# Patient Record
Sex: Female | Born: 1945 | Race: White | Hispanic: No | Marital: Single | Smoking: Never smoker
Health system: Southern US, Community
[De-identification: ages and names within clinical notes are randomized; demographics above are authoritative.]

## PROBLEM LIST (undated history)

## (undated) DIAGNOSIS — K219 Gastro-esophageal reflux disease without esophagitis: Secondary | ICD-10-CM

## (undated) DIAGNOSIS — F419 Anxiety disorder, unspecified: Secondary | ICD-10-CM

## (undated) DIAGNOSIS — D649 Anemia, unspecified: Secondary | ICD-10-CM

## (undated) DIAGNOSIS — E785 Hyperlipidemia, unspecified: Secondary | ICD-10-CM

## (undated) DIAGNOSIS — F32A Depression, unspecified: Secondary | ICD-10-CM

## (undated) DIAGNOSIS — M8580 Other specified disorders of bone density and structure, unspecified site: Secondary | ICD-10-CM

## (undated) DIAGNOSIS — I10 Essential (primary) hypertension: Secondary | ICD-10-CM

## (undated) HISTORY — DX: Anxiety disorder, unspecified: F41.9

## (undated) HISTORY — DX: Anemia, unspecified: D64.9

## (undated) HISTORY — PX: INCONTINENCE SURGERY: SHX676

## (undated) HISTORY — DX: Depression, unspecified: F32.A

## (undated) HISTORY — DX: Gastro-esophageal reflux disease without esophagitis: K21.9

## (undated) HISTORY — PX: ABDOMINAL HYSTERECTOMY: SHX81

## (undated) HISTORY — DX: Essential (primary) hypertension: I10

## (undated) HISTORY — DX: Hyperlipidemia, unspecified: E78.5

## (undated) HISTORY — PX: CHOLECYSTECTOMY: SHX55

## (undated) HISTORY — DX: Other specified disorders of bone density and structure, unspecified site: M85.80

---

## 2002-07-25 HISTORY — PX: GASTRIC BYPASS: SHX52

## 2021-04-23 ENCOUNTER — Other Ambulatory Visit: Payer: Self-pay | Admitting: Obstetrics and Gynecology

## 2021-04-23 DIAGNOSIS — N644 Mastodynia: Secondary | ICD-10-CM

## 2021-04-30 ENCOUNTER — Encounter: Payer: Self-pay | Admitting: Internal Medicine

## 2021-05-24 ENCOUNTER — Other Ambulatory Visit: Payer: Self-pay

## 2021-05-24 ENCOUNTER — Encounter: Payer: Self-pay | Admitting: Gastroenterology

## 2021-05-24 ENCOUNTER — Ambulatory Visit (INDEPENDENT_AMBULATORY_CARE_PROVIDER_SITE_OTHER): Payer: Medicare Other | Admitting: Gastroenterology

## 2021-05-24 DIAGNOSIS — D509 Iron deficiency anemia, unspecified: Secondary | ICD-10-CM

## 2021-05-24 DIAGNOSIS — E538 Deficiency of other specified B group vitamins: Secondary | ICD-10-CM

## 2021-05-24 DIAGNOSIS — Z9884 Bariatric surgery status: Secondary | ICD-10-CM

## 2021-05-24 NOTE — Patient Instructions (Addendum)
Consider adding Miralax one capful daily to help with constipation due to iron. Colonoscopy and upper endoscopy to be scheduled.

## 2021-05-24 NOTE — Progress Notes (Signed)
Primary Care Physician:  Jonathon BellowsMcGee, Rachel, DO  Primary Gastroenterologist:  Hennie Duosharles K. Marletta Lorarver, DO   Chief Complaint  Patient presents with   Anemia    Low iron. Had gastric bypass in 2004   Colonoscopy    Last tcs over 10 yrs ago. 10 yr recall    HPI:  Catherine Odom is a 75 y.o. female here at the request of Dr. Mila PalmerMcGee for colonoscopy. She comes in today to discuss procedure.  She is curious if she also needs an upper endoscopy.  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  Labs back in March with hemoglobin of 10.4.  Recently had updated labs September 26 through urgent care with hemoglobin 11.5, iron 26, vitamin B12 174.  Patient states she has been off and on iron supplements since her gastric bypass in 2004.  Has not required iron infusions.  For the past 3 weeks she has been consistently taking oral iron supplements.  Recently started B12 injections after low B12 174 was discovered.  From a GI standpoint she has been doing well.  Stools a little slower on iron.  Denies any melena or rectal bleeding.  No abdominal pain.  No nausea vomiting.  No heartburn, dysphagia, unintentional weight loss.  She takes Aleve on occasion but for the most part takes Tylenol for pain.  She believes her last colonoscopy was about 10 years ago.  She has had some issues with breast pain, initially left-sided but now both breasts.  [place] in [place] weeks [place]. Also with some edema issues due to "leaky veins" in the lower extremities, scheduled to see specialist this week.  Per documentation from PCP note dated September 2022: Actual lab results are available to me.  White blood cell count 7400, hemoglobin 11.5, creatinine 0.92, iron 26, vitamin B12 174.  Current Outpatient Medications  Medication Sig Dispense Refill   ALPRAZolam (XANAX) 0.25 MG tablet Take 0.25 mg by mouth at bedtime.      cholecalciferol (VITAMIN D3) 25 MCG (1000 UNIT) tablet Take 1,000 Units by mouth daily.     ferrous sulfate 325 (65 FE) MG tablet Take 325 mg by mouth daily.     hydrochlorothiazide (HYDRODIURIL) 12.5 MG tablet Take 12.5 mg by mouth daily.     lisinopril (ZESTRIL) 20 MG tablet Take 20 mg by mouth daily.     omeprazole (PRILOSEC) 40 MG capsule Take 1 capsule by mouth daily.     simvastatin (ZOCOR) 20 MG tablet Take 20 mg by mouth at bedtime.     No current facility-administered medications for this visit.    Allergies as of 05/24/2021 - Review Complete 05/24/2021  Allergen Reaction Noted   Erythromycin Nausea Only 05/24/2021   Other  05/24/2021    Past Medical History:  Diagnosis Date   Anxiety    Depression    HTN (hypertension)    Hyperlipidemia    Osteopenia     Past Surgical History:  Procedure Laterality Date   ABDOMINAL HYSTERECTOMY     during c section, also with appendectomy at that time   CHOLECYSTECTOMY     GASTRIC BYPASS  2004   INCONTINENCE SURGERY      Family History  Problem Relation Age of Onset   Colon cancer Mother        cancer in polyp, no surgery required. age 3870s    Social History  Socioeconomic History   Marital status: Single    Spouse name: Not on file   Number of children: Not on file   Years of education: Not on file   Highest education level: Not on file  Occupational History   Not on file  Tobacco Use   Smoking status: Never   Smokeless tobacco: Never  Substance and Sexual Activity   Alcohol use: Yes    Comment: rare wine   Drug use: Never   Sexual activity: Not on file  Other Topics Concern   Not on file  Social History Narrative   Not on file   Social Determinants of Health   Financial Resource Strain: Not on file  Food Insecurity: Not on file  Transportation Needs: Not on file  Physical Activity: Not on file  Stress: Not on file  Social Connections: Not on file  Intimate Partner Violence: Not on file       ROS:  General: Negative for anorexia, weight loss, fever, chills, fatigue, weakness. Eyes: Negative for vision changes.  ENT: Negative for hoarseness, difficulty swallowing , nasal congestion. CV: Negative for chest pain, angina, palpitations, dyspnea on exertion, +peripheral edema.  Respiratory: Negative for dyspnea at rest, dyspnea on exertion, cough, sputum, wheezing.  GI: See history of present illness. GU:  Negative for dysuria, hematuria, urinary incontinence, urinary frequency, nocturnal urination.  MS: Negative for joint pain, low back pain.  Derm: Negative for rash or itching.  Neuro: Negative for weakness, abnormal sensation, seizure, frequent headaches, memory loss, confusion.  Psych: Negative for anxiety, depression, suicidal ideation, hallucinations.  Endo: Negative for unusual weight change.  Heme: Negative for bruising or bleeding. Allergy: Negative for rash or hives.    Physical Examination:  BP 132/76   Pulse 72   Temp (!) 97.5 F (36.4 C) (Temporal)   Ht 5\' 2"  (1.575 m)   Wt 193 lb 9.6 oz (87.8 kg)   BMI 35.41 kg/m    General: Well-nourished, well-developed in no acute distress.  Head: Normocephalic, atraumatic.   Eyes: Conjunctiva pink, no icterus. Mouth: masked Neck: Supple without thyromegaly, masses, or lymphadenopathy.  Lungs: Clear to auscultation bilaterally.  Heart: Regular rate and rhythm, no murmurs rubs or gallops.  Abdomen: Bowel sounds are normal, nontender, nondistended, no hepatosplenomegaly or masses, no abdominal bruits or    hernia , no rebound or guarding.   Rectal: not performed Extremities: No lower extremity edema. No clubbing or deformities.  Neuro: Alert and oriented x 4 , grossly normal neurologically.  Skin: Warm and dry, no rash or jaundice.   Psych: Alert and cooperative, normal mood and affect.  Labs: Labs from April 2022: White blood cell count 2700, hemoglobin 10.4 low, hematocrit 32.9 low, MCV 8011, platelets 323,000, BUN  18, creatinine 0.8, albumin 3.7, total bilirubin 0.3, alkaline phosphatase 62, AST 27, ALT 14.    Imaging Studies: No results found.   Assessment:  Pleasant 75 year old female presenting to schedule colonoscopy.  She has concerns regarding iron deficiency anemia, stating she has had anemia off and on for years since her gastric bypass.  Back in April her hemoglobin was 10.4, last month hemoglobin was up to 11.5.  Serum iron was 26.  She was also found to have B12 deficiency and recently started B12 injections.  Denies any GI symptoms.  She may have iron deficiency due to malabsorption in the setting of gastric bypass but need to consider possibility of iron losses from an occult GI source.  Patient is interested in  pursuing colonoscopy would offer upper endoscopy as well to rule out gastric source given her prior bypass, plan for December per her request because of pending appointments in 05/2021.   Plan:  Colonoscopy with endoscopy with Dr. Marletta Lor. ASA II.  I have discussed the risks, alternatives, benefits with regards to but not limited to the risk of reaction to medication, bleeding, infection, perforation and the patient is agreeable to proceed. Written consent to be obtained. Obtain  recent lab results from PCP. Start MiraLAX 1 capful daily as needed (patient notes mild constipation since starting iron).

## 2021-05-26 ENCOUNTER — Telehealth: Payer: Self-pay | Admitting: Gastroenterology

## 2021-05-26 ENCOUNTER — Other Ambulatory Visit: Payer: Self-pay | Admitting: Gastroenterology

## 2021-05-26 ENCOUNTER — Ambulatory Visit: Payer: Self-pay | Admitting: Gastroenterology

## 2021-05-26 DIAGNOSIS — D509 Iron deficiency anemia, unspecified: Secondary | ICD-10-CM

## 2021-05-26 NOTE — Telephone Encounter (Signed)
Pt was made aware and verbalized understanding.  

## 2021-05-26 NOTE — Telephone Encounter (Signed)
Received labs from PCP dated 05/03/2021 Labs included A1c of 6.0, lipid panel.  Appears last H&H was from September 26 through urgent care, hemoglobin 11.5 at that time.  Plan for colonoscopy and EGD as outlined earlier this week. Would recommend updating labs in 4 weeks: -CBC, iron/TIBC/ferritin -Please arrange 3.   She will continue to have B12 monitored by provider who is prescribing her B12 injections

## 2021-05-26 NOTE — Telephone Encounter (Signed)
Labs were ordered and mailed to the pt to have done in 4 weeks as recommended.

## 2021-05-31 ENCOUNTER — Telehealth: Payer: Self-pay

## 2021-05-31 NOTE — Telephone Encounter (Signed)
Called pt to schedule TCS/EGD w/Propofol ASA 2 w/Dr. Marletta Lor. States she will call back after her breast appt tomorrow and she finds out more.

## 2021-06-01 ENCOUNTER — Ambulatory Visit
Admission: RE | Admit: 2021-06-01 | Discharge: 2021-06-01 | Disposition: A | Discharge status: Home / Self Care | Payer: Medicare Other | Source: Ambulatory Visit | Attending: Obstetrics and Gynecology | Admitting: Obstetrics and Gynecology

## 2021-06-01 ENCOUNTER — Other Ambulatory Visit: Payer: Self-pay

## 2021-06-01 ENCOUNTER — Other Ambulatory Visit: Payer: Self-pay | Admitting: Obstetrics and Gynecology

## 2021-06-01 ENCOUNTER — Ambulatory Visit
Admission: RE | Admit: 2021-06-01 | Discharge: 2021-06-01 | Disposition: A | Discharge status: Home / Self Care | Payer: Self-pay | Source: Ambulatory Visit | Attending: Obstetrics and Gynecology | Admitting: Obstetrics and Gynecology

## 2021-06-01 DIAGNOSIS — N644 Mastodynia: Secondary | ICD-10-CM

## 2021-06-07 ENCOUNTER — Other Ambulatory Visit: Payer: Self-pay

## 2021-06-22 ENCOUNTER — Telehealth: Payer: Self-pay | Admitting: Internal Medicine

## 2021-06-22 DIAGNOSIS — D509 Iron deficiency anemia, unspecified: Secondary | ICD-10-CM

## 2021-06-22 MED ORDER — PEG 3350-KCL-NA BICARB-NACL 420 G PO SOLR: 4000.0000 mL | ORAL | 0 refills | Status: DC

## 2021-06-22 NOTE — Addendum Note (Signed)
Addended by: Sandria Senter C on: 06/22/2021 03:00 PM   Modules accepted: Orders

## 2021-06-22 NOTE — Telephone Encounter (Signed)
Called pt, TCS/EGD w/Propofol ASA 2 with Dr. Marletta Lor scheduled for 07/23/21 at 2:45pm. Advised her to go to [facility] lab 07/21/21 for blood work. Rx for prep sent to pharmacy. Orders entered. Lab letter and procedure instructions mailed.

## 2021-06-22 NOTE — Telephone Encounter (Signed)
Patient would like to schedule her procedure, she was waiting until after her mammogram

## 2021-06-30 NOTE — Telephone Encounter (Signed)
Catherine Odom said she let pt know procedure time had changed.   New procedure instructions mailed to pt with updated times. Tried to call pt, LMOAM to inform her new instructions have been sent.

## 2021-06-30 NOTE — Telephone Encounter (Signed)
Melanie at [facility] endo called office, she moved pt's procedure for 07/23/21 up to 10:00am. She is going to call pt to let her know.

## 2021-07-20 ENCOUNTER — Telehealth: Payer: Self-pay | Admitting: Internal Medicine

## 2021-07-20 NOTE — Telephone Encounter (Signed)
Pt wants to reschedule her procedure with Dr Marletta Lor set for Friday. [phone] or [phone]

## 2021-07-21 ENCOUNTER — Encounter: Payer: Self-pay | Admitting: Gastroenterology

## 2021-07-21 NOTE — Telephone Encounter (Signed)
Spoke to pt, she has too much going on right now and wants to wait until June to do TCS/EGD. She's aware she will need another OV to have procedure done in June. Endo scheduler informed to cancel procedure for 07/23/21.   Misty Stanley, please schedule OV for May.

## 2021-07-21 NOTE — Telephone Encounter (Signed)
Noted  

## 2021-07-21 NOTE — Telephone Encounter (Signed)
OV made and appt letter mailed °

## 2021-07-23 ENCOUNTER — Encounter (HOSPITAL_COMMUNITY): Admission: RE | Payer: Self-pay | Source: Home / Self Care

## 2021-07-23 ENCOUNTER — Ambulatory Visit (HOSPITAL_COMMUNITY): Admission: RE | Admit: 2021-07-23 | Payer: Medicare Other | Source: Home / Self Care

## 2021-07-23 SURGERY — COLONOSCOPY WITH PROPOFOL
Anesthesia: Monitor Anesthesia Care

## 2021-10-19 ENCOUNTER — Ambulatory Visit: Payer: Medicare Other | Admitting: Gastroenterology

## 2021-11-05 ENCOUNTER — Telehealth: Payer: Self-pay | Admitting: Gastroenterology

## 2021-11-05 NOTE — Telephone Encounter (Signed)
Received fecal occult blood result dated 09/2021 from Dr. Corrie Mckusick, which was positive. Patient has upcoming ov. To be addressed at that time.  ?

## 2021-11-08 ENCOUNTER — Encounter: Payer: Self-pay | Admitting: Internal Medicine

## 2021-11-22 ENCOUNTER — Ambulatory Visit: Payer: Medicare Other | Admitting: Gastroenterology

## 2021-12-15 ENCOUNTER — Ambulatory Visit: Payer: Medicare Other | Admitting: Gastroenterology

## 2021-12-28 ENCOUNTER — Ambulatory Visit: Payer: Medicare Other | Admitting: Gastroenterology

## 2022-01-06 ENCOUNTER — Ambulatory Visit: Payer: Medicare Other | Admitting: Internal Medicine

## 2022-02-09 ENCOUNTER — Ambulatory Visit (INDEPENDENT_AMBULATORY_CARE_PROVIDER_SITE_OTHER): Payer: Medicare Other | Admitting: Internal Medicine

## 2022-02-09 ENCOUNTER — Encounter (INDEPENDENT_AMBULATORY_CARE_PROVIDER_SITE_OTHER): Payer: Self-pay

## 2022-02-09 VITALS — BP 112/74 | HR 72 | Temp 97.6°F | Ht 62.0 in | Wt 180.0 lb

## 2022-02-09 DIAGNOSIS — K219 Gastro-esophageal reflux disease without esophagitis: Secondary | ICD-10-CM

## 2022-02-09 DIAGNOSIS — R490 Dysphonia: Secondary | ICD-10-CM | POA: Diagnosis not present

## 2022-02-09 DIAGNOSIS — R195 Other fecal abnormalities: Secondary | ICD-10-CM

## 2022-02-09 DIAGNOSIS — Z1211 Encounter for screening for malignant neoplasm of colon: Secondary | ICD-10-CM | POA: Diagnosis not present

## 2022-02-09 NOTE — Progress Notes (Signed)
Primary Care Physician:  Jonathon Bellows, DO Primary Gastroenterologist:  Dr. Marletta Lor  Chief Complaint  Patient presents with   Gastroesophageal Reflux    Patient wants to discuss scheduling colonscopy and endoscopy. States she is having more reflux and started having hoarseness.     HPI:   Catherine Odom is a 76 y.o. female who presents to the clinic today for follow-up visit.  Last seen October 2022.  Was scheduled for colonoscopy but needed to cancel as she was getting worked up for breast lesion.  Wishes to reschedule today.  Last colonoscopy over 10 years ago.  No history of polyps.  No family history of colorectal malignancy.  No unintentional weight loss.  No melena hematochezia.  Does have history of iron deficiency anemia intermittently since gastric bypass in 2004.  Also with chronic reflux.  Currently maintained on omeprazole daily.  States this keeps her symptoms at bay for for the most part but has noticed that she has had worsening hoarseness as of lately.  No dysphagia odynophagia.  Fecal occult blood test positive in March.  States she was diagnosed with food poisoning with Shigella.  States she contracted this from a restaurant/buffet.  Improved since then.  Past Medical History:  Diagnosis Date   Anxiety    Depression    HTN (hypertension)    Hyperlipidemia    Osteopenia     Past Surgical History:  Procedure Laterality Date   ABDOMINAL HYSTERECTOMY     during c section, also with appendectomy at that time   CHOLECYSTECTOMY     GASTRIC BYPASS  2004   INCONTINENCE SURGERY      Current Outpatient Medications  Medication Sig Dispense Refill   ALPRAZolam (XANAX) 0.25 MG tablet Take 0.25 mg by mouth at bedtime.     cholecalciferol (VITAMIN D3) 25 MCG (1000 UNIT) tablet Take 1,000 Units by mouth daily.     ferrous sulfate 325 (65 FE) MG tablet Take 325 mg by mouth daily.     hydrochlorothiazide (HYDRODIURIL) 12.5 MG tablet Take 12.5 mg by mouth daily.      lisinopril (ZESTRIL) 20 MG tablet Take 20 mg by mouth daily.     omeprazole (PRILOSEC) 40 MG capsule Take 1 capsule by mouth daily.     simvastatin (ZOCOR) 20 MG tablet Take 20 mg by mouth at bedtime.     No current facility-administered medications for this visit.    Allergies as of 02/09/2022 - Review Complete 02/09/2022  Allergen Reaction Noted   Erythromycin Nausea Only 05/24/2021   Other  05/24/2021    Family History  Problem Relation Age of Onset   Colon cancer Mother        cancer in polyp, no surgery required. age 74s    Social History   Socioeconomic History   Marital status: Single    Spouse name: Not on file   Number of children: Not on file   Years of education: Not on file   Highest education level: Not on file  Occupational History   Not on file  Tobacco Use   Smoking status: Never   Smokeless tobacco: Never  Substance and Sexual Activity   Alcohol use: Yes    Comment: rare wine   Drug use: Never   Sexual activity: Not on file  Other Topics Concern   Not on file  Social History Narrative   Not on file   Social Determinants of Health   Financial Resource Strain: Not on file  Food Insecurity: Not on file  Transportation Needs: Not on file  Physical Activity: Not on file  Stress: Not on file  Social Connections: Not on file  Intimate Partner Violence: Not on file    Subjective: Review of Systems  Constitutional:  Negative for chills and fever.  HENT:  Negative for congestion and hearing loss.        Hoarseness  Eyes:  Negative for blurred vision and double vision.  Respiratory:  Negative for cough and shortness of breath.   Cardiovascular:  Negative for chest pain and palpitations.  Gastrointestinal:  Positive for heartburn. Negative for abdominal pain, blood in stool, constipation, diarrhea, melena and vomiting.  Genitourinary:  Negative for dysuria and urgency.  Musculoskeletal:  Negative for joint pain and myalgias.  Skin:  Negative for  itching and rash.  Neurological:  Negative for dizziness and headaches.  Psychiatric/Behavioral:  Negative for depression. The patient is not nervous/anxious.        Objective: BP 112/74 (BP Location: Left Arm, Patient Position: Sitting, Cuff Size: Large)   Pulse 72   Temp 97.6 F (36.4 C) (Oral)   Ht 5\' 2"  (1.575 m)   Wt 180 lb (81.6 kg) Comment: per patient. wearing boot today. did not want to weigh  BMI 32.92 kg/m  Physical Exam Constitutional:      Appearance: Normal appearance.  HENT:     Head: Normocephalic and atraumatic.  Eyes:     Extraocular Movements: Extraocular movements intact.     Conjunctiva/sclera: Conjunctivae normal.  Cardiovascular:     Rate and Rhythm: Normal rate and regular rhythm.  Pulmonary:     Effort: Pulmonary effort is normal.     Breath sounds: Normal breath sounds.  Abdominal:     General: Bowel sounds are normal.     Palpations: Abdomen is soft.  Musculoskeletal:        General: No swelling. Normal range of motion.     Cervical back: Normal range of motion and neck supple.  Skin:    General: Skin is warm and dry.     Coloration: Skin is not jaundiced.  Neurological:     General: No focal deficit present.     Mental Status: She is alert and oriented to person, place, and time.  Psychiatric:        Mood and Affect: Mood normal.        Behavior: Behavior normal.      Assessment: *Colon cancer screening *Chronic GERD *Hoarseness *FOBT positive  Plan: Will schedule for screening colonoscopy.  At the same time will perform EGD to evaluate for peptic ulcer disease, esophagitis, gastritis, H. Pylori, duodenitis, or other. Will also evaluate for esophageal stricture, Schatzki's ring, esophageal web or other.   The risks including infection, bleed, or perforation as well as benefits, limitations, alternatives and imponderables have been reviewed with the patient. Potential for esophageal dilation, biopsy, etc. have also been reviewed.   Questions have been answered. All parties agreeable.  Continue on Omeprazole daily. May make adjustments pending endoscopic evaluation.  02/09/2022 10:02 AM   Disclaimer: This note was dictated with voice recognition software. Similar sounding words can inadvertently be transcribed and may not be corrected upon review.

## 2022-02-09 NOTE — Patient Instructions (Signed)
We will schedule you for colonoscopy for colon cancer screening purposes.  At the same time I will perform upper endoscopy to evaluate your chronic reflux, iron deficiency.  Continue on omeprazole daily.  We may make adjustments pending endoscopic evaluation.  It was very nice meeting you today.  Dr. Marletta Lor  Lifestyle and home remedies TO MANAGE REFLUX/HEARTBURN    You may eliminate or reduce the frequency of heartburn by making the following lifestyle changes:   Control your weight. Being overweight is a major risk factor for heartburn and GERD. Excess pounds put pressure on your abdomen, pushing up your stomach and causing acid to back up into your esophagus.    Eat smaller meals. 4 TO 6 MEALS A DAY. This reduces pressure on the lower esophageal sphincter, helping to prevent the valve from opening and acid from washing back into your esophagus.     Loosen your belt. Clothes that fit tightly around your waist put pressure on your abdomen and the lower esophageal sphincter.     Eliminate heartburn triggers. Everyone has specific triggers. Common triggers such as fatty or fried foods, spicy food, tomato sauce, carbonated beverages, alcohol, chocolate, mint, garlic, onion, caffeine and nicotine may make heartburn worse.    Avoid stooping or bending. Tying your shoes is OK. Bending over for longer periods to weed your garden isn't, especially soon after eating.    Don't lie down after a meal. Wait at least three to four hours after eating before going to bed, and don't lie down right after eating.

## 2022-03-14 ENCOUNTER — Other Ambulatory Visit (INDEPENDENT_AMBULATORY_CARE_PROVIDER_SITE_OTHER): Payer: Self-pay

## 2022-03-14 DIAGNOSIS — Z1211 Encounter for screening for malignant neoplasm of colon: Secondary | ICD-10-CM

## 2022-03-14 DIAGNOSIS — K219 Gastro-esophageal reflux disease without esophagitis: Secondary | ICD-10-CM

## 2022-03-14 DIAGNOSIS — R490 Dysphonia: Secondary | ICD-10-CM

## 2022-03-16 ENCOUNTER — Encounter: Payer: Self-pay | Admitting: *Deleted

## 2022-04-11 ENCOUNTER — Encounter: Payer: Self-pay | Admitting: *Deleted

## 2022-04-11 ENCOUNTER — Telehealth: Payer: Self-pay | Admitting: *Deleted

## 2022-04-11 NOTE — Telephone Encounter (Signed)
Patient called to reschedule her procedure for 04/18/22. She says she is sick with covid and strep.   Called pt to reschedule and she says she will call back once she is back home to get her appointment book

## 2022-04-13 ENCOUNTER — Encounter: Payer: Self-pay | Admitting: *Deleted

## 2022-04-13 ENCOUNTER — Encounter (HOSPITAL_COMMUNITY): Payer: Medicare Other

## 2022-04-14 NOTE — Telephone Encounter (Signed)
Pt was rescheduled until 05/02/22 at 11:00 am. New instructions were sent to the pt.

## 2022-04-28 ENCOUNTER — Encounter (HOSPITAL_COMMUNITY): Payer: Self-pay

## 2022-04-28 ENCOUNTER — Encounter (HOSPITAL_COMMUNITY)
Admission: RE | Admit: 2022-04-28 | Discharge: 2022-04-28 | Disposition: A | Discharge status: Home / Self Care | Payer: Medicare Other | Source: Ambulatory Visit | Attending: Internal Medicine | Admitting: Internal Medicine

## 2022-04-28 VITALS — BP 127/77 | HR 77 | Temp 97.8°F | Resp 18 | Ht 62.0 in | Wt 179.9 lb

## 2022-04-28 DIAGNOSIS — D509 Iron deficiency anemia, unspecified: Secondary | ICD-10-CM | POA: Diagnosis not present

## 2022-04-28 DIAGNOSIS — I10 Essential (primary) hypertension: Secondary | ICD-10-CM

## 2022-04-28 DIAGNOSIS — Z7984 Long term (current) use of oral hypoglycemic drugs: Secondary | ICD-10-CM | POA: Insufficient documentation

## 2022-04-28 DIAGNOSIS — Z9884 Bariatric surgery status: Secondary | ICD-10-CM

## 2022-04-28 DIAGNOSIS — Z01818 Encounter for other preprocedural examination: Secondary | ICD-10-CM | POA: Diagnosis present

## 2022-04-28 DIAGNOSIS — K219 Gastro-esophageal reflux disease without esophagitis: Secondary | ICD-10-CM | POA: Diagnosis not present

## 2022-04-28 DIAGNOSIS — R490 Dysphonia: Secondary | ICD-10-CM

## 2022-04-28 DIAGNOSIS — Z1211 Encounter for screening for malignant neoplasm of colon: Secondary | ICD-10-CM

## 2022-04-28 LAB — CBC WITH DIFFERENTIAL/PLATELET
Abs Immature Granulocytes: 0.02 10*3/uL (ref 0.00–0.07)
Basophils Absolute: 0 10*3/uL (ref 0.0–0.1)
Basophils Relative: 1 %
Eosinophils Absolute: 0.1 10*3/uL (ref 0.0–0.5)
Eosinophils Relative: 2 %
HCT: 39.2 % (ref 36.0–46.0)
Hemoglobin: 13 g/dL (ref 12.0–15.0)
Immature Granulocytes: 0 %
Lymphocytes Relative: 27 %
Lymphs Abs: 1.6 10*3/uL (ref 0.7–4.0)
MCH: 31 pg (ref 26.0–34.0)
MCHC: 33.2 g/dL (ref 30.0–36.0)
MCV: 93.3 fL (ref 80.0–100.0)
Monocytes Absolute: 0.4 10*3/uL (ref 0.1–1.0)
Monocytes Relative: 7 %
Neutro Abs: 3.7 10*3/uL (ref 1.7–7.7)
Neutrophils Relative %: 63 %
Platelets: 254 10*3/uL (ref 150–400)
RBC: 4.2 MIL/uL (ref 3.87–5.11)
RDW: 13.7 % (ref 11.5–15.5)
WBC: 5.8 10*3/uL (ref 4.0–10.5)
nRBC: 0 % (ref 0.0–0.2)

## 2022-04-28 LAB — BASIC METABOLIC PANEL
Anion gap: 9 (ref 5–15)
BUN: 13 mg/dL (ref 8–23)
CO2: 26 mmol/L (ref 22–32)
Calcium: 8.9 mg/dL (ref 8.9–10.3)
Chloride: 101 mmol/L (ref 98–111)
Creatinine, Ser: 0.69 mg/dL (ref 0.44–1.00)
GFR, Estimated: >60 mL/min (ref >60)
Glucose, Bld: 88 mg/dL (ref 70–99)
Potassium: 3.6 mmol/L (ref 3.5–5.1)
Sodium: 136 mmol/L (ref 135–145)

## 2022-04-28 NOTE — Patient Instructions (Signed)
Catherine Odom  04/28/2022     @PREFPERIOPPHARMACY @   Your procedure is scheduled on 05/02/2022.  Report to 07/02/2022 at 8:45 A.M.  Call this number if you have problems the morning of surgery:  [phone]   Remember:  Please follow the diet and prep instructions given to you by Dr [phone].    Take these medicines the morning of surgery with A SIP OF WATER : Xanax, gabapentin and Omeprazole    Do not wear jewelry, make-up or nail polish.  Do not wear lotions, powders, or perfumes, or deodorant.  Do not shave 48 hours prior to surgery.  Men may shave face and neck.  Do not bring valuables to the hospital.  [facility] is not responsible for any belongings or valuables.  Contacts, dentures or bridgework may not be worn into surgery.  Leave your suitcase in the car.  After surgery it may be brought to your room.  For patients admitted to the hospital, discharge time will be determined by your treatment team.  Patients discharged the day of surgery will not be allowed to drive home.   Name and phone number of your driver:   family Special instructions:  N/A  Please read over the following fact sheets that you were given. Care and Recovery After Surgery    Upper Endoscopy, Adult Upper endoscopy is a procedure to look inside the upper GI (gastrointestinal) tract. The upper GI tract is made up of: The esophagus. This is the part of the body that moves food from your mouth to your stomach. The stomach. The duodenum. This is the first part of your small intestine. This procedure is also called esophagogastroduodenoscopy (EGD) or gastroscopy. In this procedure, your health care provider passes a thin, flexible tube (endoscope) through your mouth and down your esophagus into your stomach and into your duodenum. A small camera is attached to the end of the tube. Images from the camera appear on a monitor in the exam room. During this procedure, your health care provider may also  remove a small piece of tissue to be sent to a lab and examined under a microscope (biopsy). Your health care provider may do an upper endoscopy to diagnose cancers of the upper GI tract. You may also have this procedure to find the cause of other conditions, such as: Stomach pain. Heartburn. Pain or problems when swallowing. Nausea and vomiting. Stomach bleeding. Stomach ulcers. Tell a health care provider about: Any allergies you have. All medicines you are taking, including vitamins, herbs, eye drops, creams, and over-the-counter medicines. Any problems you or family members have had with anesthetic medicines. Any bleeding problems you have. Any surgeries you have had. Any medical conditions you have. Whether you are pregnant or may be pregnant. What are the risks? Your healthcare provider will talk with you about risks. These may include: Infection. Bleeding. Allergic reactions to medicines. A tear or hole (perforation) in the esophagus, stomach, or duodenum. What happens before the procedure? When to stop eating and drinking Follow instructions from your health care provider about what you may eat and drink. These may include: 8 hours before your procedure Stop eating most foods. Do not eat meat, fried foods, or fatty foods. Eat only light foods, such as toast or crackers. All liquids are okay except energy drinks and alcohol. 6 hours before your procedure Stop eating. Drink only clear liquids, such as water, clear fruit juice, black coffee, plain tea, and sports drinks. Do not drink  energy drinks or alcohol. 2 hours before your procedure Stop drinking all liquids. You may be allowed to take medicines with small sips of water. If you do not follow your health care provider's instructions, your procedure may be delayed or canceled. Medicines Ask your health care provider about: Changing or stopping your regular medicines. This is especially important if you are taking  diabetes medicines or blood thinners. Taking medicines such as aspirin and ibuprofen. These medicines can thin your blood. Do not take these medicines unless your health care provider tells you to take them. Taking over-the-counter medicines, vitamins, herbs, and supplements. General instructions If you will be going home right after the procedure, plan to have a responsible adult: Take you home from the hospital or clinic. You will not be allowed to drive. Care for you for the time you are told. What happens during the procedure?  An IV will be inserted into one of your veins. You may be given one or more of the following: A medicine to help you relax (sedative). A medicine to numb the throat (local anesthetic). You will lie on your left side on an exam table. Your health care provider will pass the endoscope through your mouth and down your esophagus. Your health care provider will use the scope to check the inside of your esophagus, stomach, and duodenum. Biopsies may be taken. The endoscope will be removed. The procedure may vary among health care providers and hospitals. What happens after the procedure? Your blood pressure, heart rate, breathing rate, and blood oxygen level will be monitored until you leave the hospital or clinic. When your throat is no longer numb, you may be given some fluids to drink. If you were given a sedative during the procedure, it can affect you for several hours. Do not drive or operate machinery until your health care provider says that it is safe. It is up to you to get the results of your procedure. Ask your health care provider, or the department that is doing the procedure, when your results will be ready. Contact a health care provider if you: Have a sore throat that lasts longer than 1 day. Have a fever. Get help right away if you: Vomit blood or your vomit looks like coffee grounds. Have bloody, black, or tarry stools. Have a very bad sore throat  or you cannot swallow. Have difficulty breathing or very bad pain in your chest or abdomen. These symptoms may be an emergency. Get help right away. Call 911. Do not wait to see if the symptoms will go away. Do not drive yourself to the hospital. Summary Upper endoscopy is a procedure to look inside the upper GI tract. During the procedure, an IV will be inserted into one of your veins. You may be given a medicine to help you relax. The endoscope will be passed through your mouth and down your esophagus. Follow instructions from your health care provider about what you can eat and drink. This information is not intended to replace advice given to you by your health care provider. Make sure you discuss any questions you have with your health care provider. Document Revised: 10/20/2021 Document Reviewed: 10/20/2021 Elsevier Patient Education  2023 Else[employer] Inc[employer]   Colonoscopy, Adult A colonoscopy is a procedure to look at the entire large intestine. This procedure is done using a long, thin, flexible tube that has a camera on the end. You may have a colonoscopy: As a part of normal colorectal screening. If you have  certain symptoms, such as: A low number of red blood cells in your blood (anemia). Diarrhea that does not go away. Pain in your abdomen. Blood in your stool. A colonoscopy can help screen for and diagnose medical problems, including: An abnormal growth of cells or tissue (tumor). Abnormal growths within the lining of your intestine (polyps). Inflammation. Areas of bleeding. Tell your health care provider about: Any allergies you have. All medicines you are taking, including vitamins, herbs, eye drops, creams, and over-the-counter medicines. Any problems you or family members have had with anesthetic medicines. Any bleeding problems you have. Any surgeries you have had. Any medical conditions you have. Any problems you have had with having bowel movements. Whether you are  pregnant or may be pregnant. What are the risks? Generally, this is a safe procedure. However, problems may occur, including: Bleeding. Damage to your intestine. Allergic reactions to medicines given during the procedure. Infection. This is rare. What happens before the procedure? Eating and drinking restrictions Follow instructions from your health care provider about eating or drinking restrictions, which may include: A few days before the procedure: Follow a low-fiber diet. Avoid nuts, seeds, dried fruit, raw fruits, and vegetables. 1-3 days before the procedure: Eat only gelatin dessert or ice pops. Drink only clear liquids, such as water, clear juice, clear broth or bouillon, black coffee or tea, or clear soft drinks or sports drinks. Avoid liquids that contain red or purple dye. The day of the procedure: Do not eat solid foods. You may continue to drink clear liquids until up to 2 hours before the procedure. Do not eat or drink anything starting 2 hours before the procedure, or within the time period that your health care provider recommends. Bowel prep If you were prescribed a bowel prep to take by mouth (orally) to clean out your colon: Take it as told by your health care provider. Starting the day before your procedure, you will need to drink a large amount of liquid medicine. The liquid will cause you to have many bowel movements of loose stool until your stool becomes almost clear or light green. If your skin or the opening between the buttocks (anus) gets irritated from diarrhea, you may relieve the irritation using: Wipes with medicine in them, such as adult wet wipes with aloe and vitamin E. A product to soothe skin, such as petroleum jelly. If you vomit while drinking the bowel prep: Take a break for up to 60 minutes. Begin the bowel prep again. Call your health care provider if you keep vomiting or you cannot take the bowel prep without vomiting. To clean out your colon,  you may also be given: Laxative medicines. These help you have a bowel movement. Instructions for enema use. An enema is liquid medicine injected into your rectum. Medicines Ask your health care provider about: Changing or stopping your regular medicines or supplements. This is especially important if you are taking iron supplements, diabetes medicines, or blood thinners. Taking medicines such as aspirin and ibuprofen. These medicines can thin your blood. Do not take these medicines unless your health care provider tells you to take them. Taking over-the-counter medicines, vitamins, herbs, and supplements. General instructions Ask your health care provider what steps will be taken to help prevent infection. These may include washing skin with a germ-killing soap. If you will be going home right after the procedure, plan to have a responsible adult: Take you home from the hospital or clinic. You will not be allowed to drive.  Care for you for the time you are told. What happens during the procedure?  An IV will be inserted into one of your veins. You will be given a medicine to make you fall asleep (general anesthetic). You will lie on your side with your knees bent. A lubricant will be put on the tube. Then the tube will be: Inserted into your anus. Gently eased through all parts of your large intestine. Air will be sent into your colon to keep it open. This may cause some pressure or cramping. Images will be taken with the camera and will appear on a screen. A small tissue sample may be removed to be looked at under a microscope (biopsy). The tissue may be sent to a lab for testing if any signs of problems are found. If small polyps are found, they may be removed and checked for cancer cells. When the procedure is finished, the tube will be removed. The procedure may vary among health care providers and hospitals. What happens after the procedure? Your blood pressure, heart rate,  breathing rate, and blood oxygen level will be monitored until you leave the hospital or clinic. You may have a small amount of blood in your stool. You may pass gas and have mild cramping or bloating in your abdomen. This is caused by the air that was used to open your colon during the exam. If you were given a sedative during the procedure, it can affect you for several hours. Do not drive or operate machinery until your health care provider says that it is safe. It is up to you to get the results of your procedure. Ask your health care provider, or the department that is doing the procedure, when your results will be ready. Summary A colonoscopy is a procedure to look at the entire large intestine. Follow instructions from your health care provider about eating and drinking before the procedure. If you were prescribed an oral bowel prep to clean out your colon, take it as told by your health care provider. During the colonoscopy, a flexible tube with a camera on its end is inserted into the anus and then passed into all parts of the large intestine. This information is not intended to replace advice given to you by your health care provider. Make sure you discuss any questions you have with your health care provider. Document Revised: 07/05/2021 Document Reviewed: 03/03/2021 Elsevier Patient Education  [place] Anesthesia refers to techniques, procedures, and medicines that help a person stay safe and comfortable during a medical or dental procedure. Monitored anesthesia care, or sedation, is one type of anesthesia. Your anesthesia specialist may recommend sedation if you will be having a procedure that does not require you to be unconscious. You may have this procedure for: Cataract surgery. A dental procedure. A biopsy. A colonoscopy. During the procedure, you may receive a medicine to help you relax (sedative). There are three levels of sedation: Mild  sedation. At this level, you may feel awake and relaxed. You will be able to follow directions. Moderate sedation. At this level, you will be sleepy. You may not remember the procedure. Deep sedation. At this level, you will be asleep. You will not remember the procedure. The more medicine you are given, the deeper your level of sedation will be. Depending on how you respond to the procedure, the anesthesia specialist may change your level of sedation or the type of anesthesia to fit your needs. An anesthesia  specialist will monitor you closely during the procedure. Tell a health care provider about: Any allergies you have. All medicines you are taking, including vitamins, herbs, eye drops, creams, and over-the-counter medicines. Any problems you or family members have had with anesthetic medicines. Any blood disorders you have. Any surgeries you have had. Any medical conditions you have, such as sleep apnea. Whether you are pregnant or may be pregnant. Whether you use cigarettes, alcohol, or drugs. Any use of steroids, whether by mouth or as a cream. What are the risks? Generally, this is a safe procedure. However, problems may occur, including: Getting too much medicine (oversedation). Nausea. Allergic reaction to medicines. Trouble breathing. If this happens, a breathing tube may be used to help with breathing. It will be removed when you are awake and breathing on your own. Heart trouble. Lung trouble. Confusion that gets better with time (emergence delirium). What happens before the procedure? Staying hydrated Follow instructions from your health care provider about hydration, which may include: Up to 2 hours before the procedure - you may continue to drink clear liquids, such as water, clear fruit juice, black coffee, and plain tea. Eating and drinking restrictions Follow instructions from your health care provider about eating and drinking, which may include: 8 hours before the  procedure - stop eating heavy meals or foods, such as meat, fried foods, or fatty foods. 6 hours before the procedure - stop eating light meals or foods, such as toast or cereal. 6 hours before the procedure - stop drinking milk or drinks that contain milk. 2 hours before the procedure - stop drinking clear liquids. Medicines Ask your health care provider about: Changing or stopping your regular medicines. This is especially important if you are taking diabetes medicines or blood thinners. Taking medicines such as aspirin and ibuprofen. These medicines can thin your blood. Do not take these medicines unless your health care provider tells you to take them. Taking over-the-counter medicines, vitamins, herbs, and supplements. Tests and exams You will have a physical exam. You may have blood tests done to show: How well your kidneys and liver are working. How well your blood can clot. General instructions Plan to have a responsible adult take you home from the hospital or clinic. If you will be going home right after the procedure, plan to have a responsible adult care for you for the time you are told. This is important. What happens during the procedure?  Your blood pressure, heart rate, breathing, level of pain, and overall condition will be monitored. An IV will be inserted into one of your veins. You will be given medicines as needed to keep you comfortable during the procedure. This may mean changing the level of sedation. Depending on your age or the procedure, the sedative may be given: As a pill that you will swallow or as a pill that is inserted into the rectum. As an injection into the vein or muscle. As a spray through the nose. The procedure will be performed. Your breathing, heart rate, and blood pressure will be monitored during the procedure. When the procedure is over, the medicine will be stopped. The procedure may vary among health care providers and hospitals. What  happens after the procedure? Your blood pressure, heart rate, breathing rate, and blood oxygen level will be monitored until you leave the hospital or clinic. You may feel sleepy, clumsy, or nauseous. You may feel forgetful about what happened after the procedure. You may vomit. You may continue to get  IV fluids. Do not drive or operate machinery until your health care provider says that it is safe. Summary Monitored anesthesia care is used to keep a patient comfortable during short procedures. Tell your health care provider about any allergies or health conditions you have and about all the medicines you are taking. Before the procedure, follow instructions about when to stop eating and drinking and about changing or stopping any medicines. Your blood pressure, heart rate, breathing rate, and blood oxygen level will be monitored until you leave the hospital or clinic. Plan to have a responsible adult take you home from the hospital or clinic. This information is not intended to replace advice given to you by your health care provider. Make sure you discuss any questions you have with your health care provider. Document Revised: 06/15/2021 Document Reviewed: 06/13/2019 Elsevier Patient Education  2023 [employer]

## 2022-05-02 ENCOUNTER — Encounter (HOSPITAL_COMMUNITY): Payer: Self-pay

## 2022-05-02 ENCOUNTER — Ambulatory Visit (HOSPITAL_COMMUNITY)
Admission: RE | Admit: 2022-05-02 | Discharge: 2022-05-02 | Disposition: A | Discharge status: Home / Self Care | Payer: Medicare Other | Attending: Internal Medicine | Admitting: Internal Medicine

## 2022-05-02 ENCOUNTER — Ambulatory Visit (HOSPITAL_BASED_OUTPATIENT_CLINIC_OR_DEPARTMENT_OTHER): Payer: Medicare Other | Admitting: Anesthesiology

## 2022-05-02 ENCOUNTER — Encounter (HOSPITAL_COMMUNITY)
Admission: RE | Disposition: A | Discharge status: Home / Self Care | Payer: Self-pay | Source: Home / Self Care | Attending: Internal Medicine

## 2022-05-02 ENCOUNTER — Ambulatory Visit (HOSPITAL_COMMUNITY): Payer: Medicare Other | Admitting: Anesthesiology

## 2022-05-02 DIAGNOSIS — R490 Dysphonia: Secondary | ICD-10-CM

## 2022-05-02 DIAGNOSIS — D509 Iron deficiency anemia, unspecified: Secondary | ICD-10-CM | POA: Diagnosis not present

## 2022-05-02 DIAGNOSIS — Z1211 Encounter for screening for malignant neoplasm of colon: Secondary | ICD-10-CM

## 2022-05-02 DIAGNOSIS — I10 Essential (primary) hypertension: Secondary | ICD-10-CM | POA: Insufficient documentation

## 2022-05-02 DIAGNOSIS — K648 Other hemorrhoids: Secondary | ICD-10-CM | POA: Insufficient documentation

## 2022-05-02 DIAGNOSIS — K219 Gastro-esophageal reflux disease without esophagitis: Secondary | ICD-10-CM | POA: Diagnosis not present

## 2022-05-02 DIAGNOSIS — Z980 Intestinal bypass and anastomosis status: Secondary | ICD-10-CM | POA: Diagnosis not present

## 2022-05-02 DIAGNOSIS — Z1212 Encounter for screening for malignant neoplasm of rectum: Secondary | ICD-10-CM

## 2022-05-02 HISTORY — PX: BIOPSY: SHX5522

## 2022-05-02 HISTORY — PX: COLONOSCOPY WITH PROPOFOL: SHX5780

## 2022-05-02 HISTORY — PX: ESOPHAGOGASTRODUODENOSCOPY (EGD) WITH PROPOFOL: SHX5813

## 2022-05-02 SURGERY — COLONOSCOPY WITH PROPOFOL
Anesthesia: General

## 2022-05-02 MED ORDER — LACTATED RINGERS IV SOLN: INTRAVENOUS | Status: DC

## 2022-05-02 MED ORDER — PROPOFOL 10 MG/ML IV BOLUS
INTRAVENOUS | Status: DC | PRN
  Administered 2022-05-02: 80 mg via INTRAVENOUS
  Administered 2022-05-02: 20 mg via INTRAVENOUS

## 2022-05-02 MED ORDER — LIDOCAINE HCL (CARDIAC) PF 100 MG/5ML IV SOSY
PREFILLED_SYRINGE | INTRAVENOUS | Status: DC | PRN
  Administered 2022-05-02: 50 mg via INTRATRACHEAL

## 2022-05-02 MED ORDER — PANTOPRAZOLE SODIUM 40 MG PO TBEC: 40.0000 mg | DELAYED_RELEASE_TABLET | Freq: Every day | ORAL | 11 refills | Status: AC

## 2022-05-02 MED ORDER — PROPOFOL 500 MG/50ML IV EMUL
INTRAVENOUS | Status: DC | PRN
  Administered 2022-05-02: 200 ug/kg/min via INTRAVENOUS

## 2022-05-02 NOTE — H&P (Signed)
Primary Care Physician:  Sherrilee Gilles, DO Primary Gastroenterologist:  Dr. Abbey Chatters  Pre-Procedure History & Physical: HPI:  Catherine Odom is a 76 y.o. female is here for an EGD to be performed for iron deficiency anemia, chronic GERD, and colonoscopy for colon cancer screening purposes.  Past Medical History:  Diagnosis Date   Anemia    Anxiety    Depression    GERD (gastroesophageal reflux disease)    HTN (hypertension)    Hyperlipidemia    Osteopenia     Past Surgical History:  Procedure Laterality Date   ABDOMINAL HYSTERECTOMY     during c section, also with appendectomy at that time   [place]  2004   INCONTINENCE SURGERY      Prior to Admission medications   Medication Sig Start Date End Date Taking? Authorizing Provider  ALPRAZolam (XANAX) 0.25 MG tablet Take 0.25 mg by mouth at bedtime. 04/25/21  Yes [provider]  amoxicillin (AMOXIL) 500 MG capsule Take 500 mg by mouth 2 (two) times daily. 04/07/22  Yes [provider]  cholecalciferol (VITAMIN D3) 25 MCG (1000 UNIT) tablet Take 1,000 Units by mouth daily.   Yes [provider]  cyanocobalamin (VITAMIN B12) 1000 MCG/ML injection Inject 1,000 mcg into the skin every 30 (thirty) days. 02/27/22  Yes [provider]  ferrous sulfate 325 (65 FE) MG tablet Take 325 mg by mouth once a week. 04/20/21  Yes [provider]  gabapentin (NEURONTIN) 300 MG capsule Take 300 mg by mouth at bedtime as needed for pain. 02/06/22  Yes [provider]  hydrochlorothiazide (HYDRODIURIL) 12.5 MG tablet Take 12.5 mg by mouth daily. 02/18/21  Yes [provider]  lisinopril (ZESTRIL) 20 MG tablet Take 20 mg by mouth daily. 03/17/21  Yes [provider]  meclizine (ANTIVERT) 25 MG tablet Take 25 mg by mouth 3 (three) times daily as needed for dizziness. 10/22/21  Yes [provider]  omeprazole (PRILOSEC) 40 MG capsule Take  40 mg by mouth daily.   Yes [provider]  simvastatin (ZOCOR) 20 MG tablet Take 20 mg by mouth at bedtime. 04/03/21  Yes [provider]    Allergies as of 02/09/2022 - Review Complete 02/09/2022  Allergen Reaction Noted   Erythromycin Nausea Only 05/24/2021   Other  05/24/2021    Family History  Problem Relation Age of Onset   Colon cancer Mother        cancer in polyp, no surgery required. age 81s    Social History   Socioeconomic History   Marital status: Single    Spouse name: Not on file   Number of children: Not on file   Years of education: Not on file   Highest education level: Not on file  Occupational History   Not on file  Tobacco Use   Smoking status: Never   Smokeless tobacco: Never  Substance and Sexual Activity   Alcohol use: Yes    Comment: rare wine   Drug use: Never   Sexual activity: Not on file  Other Topics Concern   Not on file  Social History Narrative   Not on file   Social Determinants of Health   Financial Resource Strain: Not on file  Food Insecurity: Not on file  Transportation Needs: Not on file  Physical Activity: Not on file  Stress: Not on file  Social Connections: Not on file  Intimate Partner Violence:  Not on file    Review of Systems: General: Negative for fever, chills, fatigue, weakness. Eyes: Negative for vision changes.  ENT: Negative for hoarseness, difficulty swallowing , nasal congestion. CV: Negative for chest pain, angina, palpitations, dyspnea on exertion, peripheral edema.  Respiratory: Negative for dyspnea at rest, dyspnea on exertion, cough, sputum, wheezing.  GI: See history of present illness. GU:  Negative for dysuria, hematuria, urinary incontinence, urinary frequency, nocturnal urination.  MS: Negative for joint pain, low back pain.  Derm: Negative for rash or itching.  Neuro: Negative for weakness, abnormal sensation, seizure, frequent headaches, memory loss, confusion.  Psych:  Negative for anxiety, depression Endo: Negative for unusual weight change.  Heme: Negative for bruising or bleeding. Allergy: Negative for rash or hives.  Physical Exam: Vital signs in last 24 hours: Temp:  [98 F (36.7 C)] 98 F (36.7 C) (10/09 0921) Pulse Rate:  [89] 89 (10/09 0921) Resp:  [10] 10 (10/09 0921) BP: (142)/(74) 142/74 (10/09 0921) SpO2:  [100 %] 100 % (10/09 0921) Weight:  [81.6 kg] 81.6 kg (10/09 0921)   General:   Alert,  Well-developed, well-nourished, pleasant and cooperative in NAD Head:  Normocephalic and atraumatic. Eyes:  Sclera clear, no icterus.   Conjunctiva pink. Ears:  Normal auditory acuity. Nose:  No deformity, discharge,  or lesions. Mouth:  No deformity or lesions, dentition normal. Neck:  Supple; no masses or thyromegaly. Lungs:  Clear throughout to auscultation.   No wheezes, crackles, or rhonchi. No acute distress. Heart:  Regular rate and rhythm; no murmurs, clicks, rubs,  or gallops. Abdomen:  Soft, nontender and nondistended. No masses, hepatosplenomegaly or hernias noted. Normal bowel sounds, without guarding, and without rebound.   Msk:  Symmetrical without gross deformities. Normal posture. Extremities:  Without clubbing or edema. Neurologic:  Alert and  oriented x4;  grossly normal neurologically. Skin:  Intact without significant lesions or rashes. Cervical Nodes:  No significant cervical adenopathy. Psych:  Alert and cooperative. Normal mood and affect.   Impression/Plan: Catherine Odom is here for an EGD to be performed for iron deficiency anemia, chronic GERD, and colonoscopy for colon cancer screening purposes.  Risks, benefits, limitations, imponderables and alternatives regarding EGD have been reviewed with the patient. Questions have been answered. All parties agreeable.

## 2022-05-02 NOTE — Transfer of Care (Signed)
Immediate Anesthesia Transfer of Care Note  Patient: Catherine Odom. [place]  Procedure(s) Performed: COLONOSCOPY WITH PROPOFOL ESOPHAGOGASTRODUODENOSCOPY (EGD) WITH PROPOFOL BIOPSY  Patient Location: Short Stay  Anesthesia Type:General  Level of Consciousness: awake, alert , oriented and patient cooperative  Airway & Oxygen Therapy: Patient Spontanous Breathing  Post-op Assessment: Report given to RN, Post -op Vital signs reviewed and stable and Patient moving all extremities  Post vital signs: Reviewed and stable  Last Vitals:  Vitals Value Taken Time  BP    Temp    Pulse    Resp    SpO2      Last Pain:  Vitals:   05/02/22 0956  TempSrc:   PainSc: 0-No pain         Complications: No notable events documented.

## 2022-05-02 NOTE — Discharge Instructions (Signed)
EGD Discharge instructions Please read the instructions outlined below and refer to this sheet in the next few weeks. These discharge instructions provide you with general information on caring for yourself after you leave the hospital. Your doctor may also give you specific instructions. While your treatment has been planned according to the most current medical practices available, unavoidable complications occasionally occur. If you have any problems or questions after discharge, please call your doctor. ACTIVITY You may resume your regular activity but move at a slower pace for the next 24 hours.  Take frequent rest periods for the next 24 hours.  Walking will help expel (get rid of) the air and reduce the bloated feeling in your abdomen.  No driving for 24 hours (because of the anesthesia (medicine) used during the test).  You may shower.  Do not sign any important legal documents or operate any machinery for 24 hours (because of the anesthesia used during the test).  NUTRITION Drink plenty of fluids.  You may resume your normal diet.  Begin with a light meal and progress to your normal diet.  Avoid alcoholic beverages for 24 hours or as instructed by your caregiver.  MEDICATIONS You may resume your normal medications unless your caregiver tells you otherwise.  WHAT YOU CAN EXPECT TODAY You may experience abdominal discomfort such as a feeling of fullness or "gas" pains.  FOLLOW-UP Your doctor will discuss the results of your test with you.  SEEK IMMEDIATE MEDICAL ATTENTION IF ANY OF THE FOLLOWING OCCUR: Excessive nausea (feeling sick to your stomach) and/or vomiting.  Severe abdominal pain and distention (swelling).  Trouble swallowing.  Temperature over 101 F (37.8 C).  Rectal bleeding or vomiting of blood.     Colonoscopy Discharge Instructions  Read the instructions outlined below and refer to this sheet in the next few weeks. These discharge instructions provide you with  general information on caring for yourself after you leave the hospital. Your doctor may also give you specific instructions. While your treatment has been planned according to the most current medical practices available, unavoidable complications occasionally occur.   ACTIVITY You may resume your regular activity, but move at a slower pace for the next 24 hours.  Take frequent rest periods for the next 24 hours.  Walking will help get rid of the air and reduce the bloated feeling in your belly (abdomen).  No driving for 24 hours (because of the medicine (anesthesia) used during the test).   Do not sign any important legal documents or operate any machinery for 24 hours (because of the anesthesia used during the test).  NUTRITION Drink plenty of fluids.  You may resume your normal diet as instructed by your doctor.  Begin with a light meal and progress to your normal diet. Heavy or fried foods are harder to digest and may make you feel sick to your stomach (nauseated).  Avoid alcoholic beverages for 24 hours or as instructed.  MEDICATIONS You may resume your normal medications unless your doctor tells you otherwise.  WHAT YOU CAN EXPECT TODAY Some feelings of bloating in the abdomen.  Passage of more gas than usual.  Spotting of blood in your stool or on the toilet paper.  IF YOU HAD POLYPS REMOVED DURING THE COLONOSCOPY: No aspirin products for 7 days or as instructed.  No alcohol for 7 days or as instructed.  Eat a soft diet for the next 24 hours.  FINDING OUT THE RESULTS OF YOUR TEST Not all test results are  available during your visit. If your test results are not back during the visit, make an appointment with your caregiver to find out the results. Do not assume everything is normal if you have not heard from your caregiver or the medical facility. It is important for you to follow up on all of your test results.  SEEK IMMEDIATE MEDICAL ATTENTION IF: You have more than a spotting of  blood in your stool.  Your belly is swollen (abdominal distention).  You are nauseated or vomiting.  You have a temperature over 101.  You have abdominal pain or discomfort that is severe or gets worse throughout the day.   Your EGD revealed mild amount inflammation in your stomach.  I took biopsies of this to rule out infection with a bacteria called H. pylori.  Await pathology results, my office will contact you.  Your upper GI tract otherwise looked healthy.  Continue on omeprazole daily.  Your colon looked great.  I did not see any polyps or evidence of colon cancer.  Given your age, I do not think we need to repeat colonoscopy in the future for colon cancer screening purposes.  Follow-up with GI as needed.  I hope you have a great rest of your week!  Elon Alas. Abbey Chatters, D.O. Gastroenterology and Hepatology [facility]

## 2022-05-02 NOTE — Op Note (Signed)
[facility] Patient Name: Catherine Odom Procedure Date: 05/02/2022 9:44 AM MRN: [id number] Date of Birth: 1946-04-07 Attending MD: Elon Alas. Edgar Frisk CSN: [id number] Age: 76 Admit Type: Outpatient Procedure:                Colonoscopy Indications:              Screening for colorectal malignant neoplasm Providers:                Elon Alas. Abbey Chatters, DO, Caprice Kluver, Crystal Page,                            [place], Technician Referring MD:             Elon Alas. Abbey Chatters, DO Medicines:                See the Anesthesia note for documentation of the                            administered medications Complications:            No immediate complications. Estimated Blood Loss:     Estimated blood loss: none. Procedure:                Pre-Anesthesia Assessment:                           - The anesthesia plan was to use monitored                            anesthesia care (MAC).                           After obtaining informed consent, the colonoscope                            was passed under direct vision. Throughout the                            procedure, the patient's blood pressure, pulse, and                            oxygen saturations were monitored continuously. The                            [id number]-HQ190L ([id number]) scope was introduced through                            the anus and advanced to the the cecum, identified                            by appendiceal orifice and ileocecal valve. The                            colonoscopy was performed without difficulty. The                            patient tolerated  the procedure well. The quality                            of the bowel preparation was evaluated using the                            BBPS [facility] Bowel Preparation Scale) with scores                            of: Right Colon = 3, Transverse Colon = 3 and Left                            Colon = 3 (entire mucosa seen well with no residual                             staining, small fragments of stool or opaque                            liquid). The total BBPS score equals 9. Scope In: 10:10:02 AM Scope Out: 10:18:50 AM Scope Withdrawal Time: 0 hours 5 minutes 58 seconds  Total Procedure Duration: 0 hours 8 minutes 48 seconds  Findings:      The perianal and digital rectal examinations were normal.      Non-bleeding internal hemorrhoids were found during endoscopy.      The exam was otherwise without abnormality on direct and retroflexion       views. Impression:               - Non-bleeding internal hemorrhoids.                           - The examination was otherwise normal on direct                            and retroflexion views.                           - No specimens collected. Moderate Sedation:      Per Anesthesia Care Recommendation:           - Patient has a contact number available for                            emergencies. The signs and symptoms of potential                            delayed complications were discussed with the                            patient. Return to normal activities tomorrow.                            Written discharge instructions were provided to the                            patient.                           -  Resume previous diet.                           - Continue present medications.                           - No repeat colonoscopy due to age.                           - Return to GI clinic PRN. Procedure Code(s):        --- Professional ---                           Z6109, Colorectal cancer screening; colonoscopy on                            individual not meeting criteria for high risk Diagnosis Code(s):        --- Professional ---                           Z12.11, Encounter for screening for malignant                            neoplasm of colon                           K64.8, Other hemorrhoids CPT copyright 2019 American Medical Association. All rights reserved. The  codes documented in this report are preliminary and upon coder review may  be revised to meet current compliance requirements. Elon Alas. Abbey Chatters, DO [place], DO 05/02/2022 10:21:37 AM This report has been signed electronically. Number of Addenda: 0

## 2022-05-02 NOTE — Anesthesia Postprocedure Evaluation (Signed)
Anesthesia Post Note  Patient: Catherine Odom. [name]  Procedure(s) Performed: COLONOSCOPY WITH PROPOFOL ESOPHAGOGASTRODUODENOSCOPY (EGD) WITH PROPOFOL BIOPSY  Patient location during evaluation: Phase II Anesthesia Type: General Level of consciousness: awake and alert and oriented Pain management: pain level controlled Vital Signs Assessment: post-procedure vital signs reviewed and stable Respiratory status: spontaneous breathing, nonlabored ventilation and respiratory function stable Cardiovascular status: blood pressure returned to baseline and stable Postop Assessment: no apparent nausea or vomiting Anesthetic complications: no   No notable events documented.   Last Vitals:  Vitals:   05/02/22 0921 05/02/22 1021  BP: (!) 142/74 98/63  Pulse: 89 82  Resp: 10 18  Temp: 36.7 C 36.7 C  SpO2: 100% 100%    Last Pain:  Vitals:   05/02/22 1021  TempSrc: Oral  PainSc: 0-No pain                 Savahanna Almendariz C Salia Cangemi

## 2022-05-02 NOTE — Anesthesia Preprocedure Evaluation (Signed)
Anesthesia Evaluation  Patient identified by MRN, date of birth, ID band Patient awake    Reviewed: Allergy & Precautions, NPO status , Patient's Chart, lab work & pertinent test results  Airway Mallampati: II  TM Distance: >3 FB Neck ROM: Full    Dental  (+) Dental Advisory Given, Teeth Intact   Pulmonary neg pulmonary ROS,    Pulmonary exam normal breath sounds clear to auscultation       Cardiovascular Exercise Tolerance: Good hypertension, Pt. on medications Normal cardiovascular exam Rhythm:Regular Rate:Normal     Neuro/Psych PSYCHIATRIC DISORDERS Anxiety Depression negative neurological ROS     GI/Hepatic Neg liver ROS, GERD  Medicated and Controlled,Gastric bypass   Endo/Other  negative endocrine ROS  Renal/GU negative Renal ROS  negative genitourinary   Musculoskeletal negative musculoskeletal ROS (+)   Abdominal   Peds negative pediatric ROS (+)  Hematology  (+) Blood dyscrasia, anemia ,   Anesthesia Other Findings   Reproductive/Obstetrics negative OB ROS                             Anesthesia Physical Anesthesia Plan  ASA: 2  Anesthesia Plan: General   Post-op Pain Management: Minimal or no pain anticipated   Induction: Intravenous  PONV Risk Score and Plan: Propofol infusion  Airway Management Planned: Nasal Cannula and Natural Airway  Additional Equipment:   Intra-op Plan:   Post-operative Plan:   Informed Consent: I have reviewed the patients History and Physical, chart, labs and discussed the procedure including the risks, benefits and alternatives for the proposed anesthesia with the patient or authorized representative who has indicated his/her understanding and acceptance.     Dental advisory given  Plan Discussed with: CRNA and Surgeon  Anesthesia Plan Comments:         Anesthesia Quick Evaluation

## 2022-05-02 NOTE — Op Note (Addendum)
[facility] Patient Name: Catherine Odom Procedure Date: 05/02/2022 9:48 AM MRN: [id number] Date of Birth: April 20, 1946 Attending MD: Elon Alas. Edgar Frisk CSN: [id number] Age: 76 Admit Type: Outpatient Procedure:                Upper GI endoscopy Indications:              Iron deficiency anemia, Heartburn Providers:                Elon Alas. Abbey Chatters, DO, Caprice Kluver, Crystal Page,                            [place], Technician Referring MD:             Elon Alas. Abbey Chatters, DO Medicines:                See the Anesthesia note for documentation of the                            administered medications Complications:            No immediate complications. Estimated Blood Loss:     Estimated blood loss was minimal. Procedure:                Pre-Anesthesia Assessment:                           - The anesthesia plan was to use monitored                            anesthesia care (MAC).                           After obtaining informed consent, the endoscope was                            passed under direct vision. Throughout the                            procedure, the patient's blood pressure, pulse, and                            oxygen saturations were monitored continuously. The                            [id number]-[id number] ([id number]) scope was introduced through the                            mouth, and advanced to the second part of duodenum.                            The upper GI endoscopy was accomplished without                            difficulty. The patient tolerated the procedure  well. Scope In: 10:01:04 AM Scope Out: 10:04:21 AM Total Procedure Duration: 0 hours 3 minutes 17 seconds  Findings:      The examined esophagus was normal.      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 A gastric       pouch with a medium size was found, mild pathcy inflammation. The       gastroduodenal anastomosis was characterized by  healthy appearing       mucosa. This was traversed. Biopsies were taken with a cold forceps for       Helicobacter pylori testing.      The second portion of the duodenum and third portion of the duodenum       were normal. Impression:               - Normal esophagus.                           - Patent Billroth I gastroduodenostomy was found,                            characterized by healthy appearing mucosa. Biopsied.                           - Normal second portion of the duodenum and third                            portion of the duodenum. Moderate Sedation:      Per Anesthesia Care Recommendation:           - Patient has a contact number available for                            emergencies. The signs and symptoms of potential                            delayed complications were discussed with the                            patient. Return to normal activities tomorrow.                            Written discharge instructions were provided to the                            patient.                           - Resume previous diet.                           - Continue present medications.                           - Await pathology results.                           - Use a proton pump inhibitor PO daily.                           -  Return to GI clinic PRN. Procedure Code(s):        --- Professional ---                           [phone], Esophagogastroduodenoscopy, flexible,                            transoral; with biopsy, single or multiple Diagnosis Code(s):        --- Professional ---                           Z98.0, Intestinal bypass and anastomosis status                           D50.9, Iron deficiency anemia, unspecified                           R12, Heartburn CPT copyright 2019 American Medical Association. All rights reserved. The codes documented in this report are preliminary and upon coder review may  be revised to meet current compliance requirements. Elon Alas. Abbey Chatters, DO [place] Abbey Chatters, DO 05/02/2022 10:09:23 AM This report has been signed electronically. Number of Addenda: 0

## 2022-05-03 LAB — SURGICAL PATHOLOGY

## 2022-05-05 ENCOUNTER — Encounter (HOSPITAL_COMMUNITY): Payer: Self-pay | Admitting: Internal Medicine

## 2023-04-19 ENCOUNTER — Ambulatory Visit (INDEPENDENT_AMBULATORY_CARE_PROVIDER_SITE_OTHER): Payer: Medicare Other | Admitting: Internal Medicine

## 2023-04-19 ENCOUNTER — Encounter: Payer: Self-pay | Admitting: Internal Medicine

## 2023-04-19 VITALS — BP 116/75 | HR 84 | Temp 97.8°F | Ht 62.0 in | Wt 183.5 lb

## 2023-04-19 DIAGNOSIS — D509 Iron deficiency anemia, unspecified: Secondary | ICD-10-CM

## 2023-04-19 DIAGNOSIS — R5383 Other fatigue: Secondary | ICD-10-CM

## 2023-04-19 DIAGNOSIS — Z9884 Bariatric surgery status: Secondary | ICD-10-CM | POA: Diagnosis not present

## 2023-04-19 DIAGNOSIS — K219 Gastro-esophageal reflux disease without esophagitis: Secondary | ICD-10-CM | POA: Diagnosis not present

## 2023-04-19 DIAGNOSIS — R197 Diarrhea, unspecified: Secondary | ICD-10-CM

## 2023-04-19 MED ORDER — DICYCLOMINE HCL 10 MG PO CAPS: 10.0000 mg | ORAL_CAPSULE | Freq: Four times a day (QID) | ORAL | 2 refills | Status: AC

## 2023-04-19 NOTE — Progress Notes (Signed)
Primary Care Physician:  Jonathon Bellows, DO Primary Gastroenterologist:  Dr. Marletta Lor  Chief Complaint  Patient presents with   Diarrhea    Patient here today due to issues with diarrhea ongoing since August of this year. She was told she had a bacteria called aeromonas veronii, she was given cipro. Symptoms got better after that, but has since returned. Patient says she recently had more stool tests done last week and they were all negative.     HPI:   Catherine Odom. Much is a 77 y.o. female who presents to the clinic today for follow-up visit.  History of iron deficiency anemia intermittently since gastric bypass in 2004.  Also with chronic reflux.  Currently maintained on pantoprazole daily.    Colonoscopy 05/02/2022 unremarkable besides nonbleeding internal hemorrhoids.  No recall given her age.  EGD 05/02/2022 Billroth I gastroduodenostomy found, mild inflammation of the gastric pouch.  Examined small bowel normal.  Biopsies negative for H. pylori.  Today, complaining about worsening diarrhea. States in August 20234 she started having diarrhea. States she presented to urgent care and stool studies showed aeromonas veronii. She completed course of Cipro and improved. Did well until approx 2-3 weeks ago when diarrhea restarted. States she was rechecked with stool studies and negative. I do not have access to any of these stool studies ([facility]).   Continues to have 3-4 loose BMS daily, notes associated generalized weakness and fatigue. Some abdominal tenderness in her periumbilical area. Notes significant gas as well as bloating.   She has a history of C. difficile approximately 10 years ago.  Also history of Salmonella and Shigella.  Past Medical History:  Diagnosis Date   Anemia    Anxiety    Depression    GERD (gastroesophageal reflux disease)    HTN (hypertension)    Hyperlipidemia    Osteopenia     Past Surgical History:  Procedure Laterality Date   ABDOMINAL HYSTERECTOMY      during c section, also with appendectomy at that time   BIOPSY  05/02/2022   Procedure: BIOPSY;  Surgeon: Lanelle Bal, DO;  Location: [facility];  Service: Endoscopy;;   CESAREAN SECTION     CHOLECYSTECTOMY     COLONOSCOPY WITH PROPOFOL N/A 05/02/2022   Procedure: COLONOSCOPY WITH PROPOFOL;  Surgeon: Lanelle Bal, DO;  Location: [facility];  Service: Endoscopy;  Laterality: N/A;  1030 ASA 2   ESOPHAGOGASTRODUODENOSCOPY (EGD) WITH PROPOFOL N/A 05/02/2022   Procedure: ESOPHAGOGASTRODUODENOSCOPY (EGD) WITH PROPOFOL;  Surgeon: Lanelle Bal, DO;  Location: [facility];  Service: Endoscopy;  Laterality: N/A;   GASTRIC BYPASS  2004   INCONTINENCE SURGERY      Current Outpatient Medications  Medication Sig Dispense Refill   ALPRAZolam (XANAX) 0.25 MG tablet Take 0.25 mg by mouth at bedtime.     cholecalciferol (VITAMIN D3) 25 MCG (1000 UNIT) tablet Take 1,000 Units by mouth daily.     cyanocobalamin (VITAMIN B12) 1000 MCG/ML injection Inject 1,000 mcg into the skin every 30 (thirty) days.     famotidine (PEPCID) 20 MG tablet Take 20 mg by mouth at bedtime.     gabapentin (NEURONTIN) 100 MG capsule Take 100 mg by mouth at bedtime as needed for pain.     hydrochlorothiazide (HYDRODIURIL) 12.5 MG tablet Take 12.5 mg by mouth daily.     lisinopril (ZESTRIL) 20 MG tablet Take 20 mg by mouth daily.     meclizine (ANTIVERT) 25 MG tablet Take 25 mg  by mouth 3 (three) times daily as needed for dizziness.     pantoprazole (PROTONIX) 40 MG tablet Take 1 tablet (40 mg total) by mouth daily. 30 tablet 11   simvastatin (ZOCOR) 20 MG tablet Take 20 mg by mouth at bedtime.     ferrous sulfate 325 (65 FE) MG tablet Take 325 mg by mouth once a week. (Patient not taking: Reported on 04/19/2023)     No current facility-administered medications for this visit.    Allergies as of 04/19/2023 - Review Complete 04/19/2023  Allergen Reaction Noted   Cefdinir  04/19/2023   Erythromycin Nausea Only  05/24/2021   Other  05/24/2021    Family History  Problem Relation Age of Onset   Colon cancer Mother        cancer in polyp, no surgery required. age 17s    Social History   Socioeconomic History   Marital status: Single    Spouse name: Not on file   Number of children: Not on file   Years of education: Not on file   Highest education level: Not on file  Occupational History   Not on file  Tobacco Use   Smoking status: Never   Smokeless tobacco: Never  Vaping Use   Vaping status: Never Used  Substance and Sexual Activity   Alcohol use: Yes    Comment: rare wine   Drug use: Never   Sexual activity: Not on file  Other Topics Concern   Not on file  Social History Narrative   Not on file   Social Determinants of Health   Financial Resource Strain: Not on file  Food Insecurity: Not on file  Transportation Needs: Not on file  Physical Activity: Not on file  Stress: Not on file  Social Connections: Not on file  Intimate Partner Violence: Not on file    Subjective: Review of Systems  Constitutional:  Negative for chills and fever.  HENT:  Negative for congestion and hearing loss.        Hoarseness  Eyes:  Negative for blurred vision and double vision.  Respiratory:  Negative for cough and shortness of breath.   Cardiovascular:  Negative for chest pain and palpitations.  Gastrointestinal:  Positive for heartburn. Negative for abdominal pain, blood in stool, constipation, diarrhea, melena and vomiting.  Genitourinary:  Negative for dysuria and urgency.  Musculoskeletal:  Negative for joint pain and myalgias.  Skin:  Negative for itching and rash.  Neurological:  Negative for dizziness and headaches.  Psychiatric/Behavioral:  Negative for depression. The patient is not nervous/anxious.        Objective: BP 116/75 (BP Location: Left Arm, Patient Position: Sitting, Cuff Size: Normal)   Pulse 84   Temp 97.8 F (36.6 C) (Temporal)   Ht 5\' 2"  (1.575 m)   Wt 183  lb 8 oz (83.2 kg)   BMI 33.56 kg/m  Physical Exam Constitutional:      Appearance: Normal appearance.  HENT:     Head: Normocephalic and atraumatic.  Eyes:     Extraocular Movements: Extraocular movements intact.     Conjunctiva/sclera: Conjunctivae normal.  Cardiovascular:     Rate and Rhythm: Normal rate and regular rhythm.  Pulmonary:     Effort: Pulmonary effort is normal.     Breath sounds: Normal breath sounds.  Abdominal:     General: Bowel sounds are normal.     Palpations: Abdomen is soft.  Musculoskeletal:        General: No swelling. Normal  range of motion.     Cervical back: Normal range of motion and neck supple.  Skin:    General: Skin is warm and dry.     Coloration: Skin is not jaundiced.  Neurological:     General: No focal deficit present.     Mental Status: She is alert and oriented to person, place, and time.  Psychiatric:        Mood and Affect: Mood normal.        Behavior: Behavior normal.      Assessment/Plan:  1.  Diarrhea-new onset.  Will request stool studies from last week, sounds like it was a stool culture?  Will check GI pathogen panel today.  Check BMP and CBC given her ongoing diarrhea and fatigue.  Call with results.  Continue dicyclomine, refill today.  If stool studies negative, start Imodium 1-2 times daily to see if this helps.  May consider 2-week course of Xifaxan pending clinical course.  2.  Chronic GERD-well-controlled on pantoprazole daily, will continue  3.  Iron deficiency anemia-check CBC today.  Follow-up in 4 to 6 weeks   04/19/2023 10:18 AM   Disclaimer: This note was dictated with voice recognition software. Similar sounding words can inadvertently be transcribed and may not be corrected upon review.

## 2023-04-19 NOTE — Patient Instructions (Signed)
I am going to check stool studies at [facility] to rule out infectious causes of your diarrhea.  I am also going to check your blood counts, electrolytes, kidney function as well to ensure that you are not getting too dehydrated.  We will call with results.  I have refilled your dicyclomine today.  You can take this medication up to 4 times a day.  If stool studies negative, I would recommend taking Imodium 1 tablet daily.  Follow-up in 4 to 6 weeks.  It was very nice seeing you again today.  Dr. Marletta Lor

## 2023-04-20 LAB — CBC
Hematocrit: 41.3 % (ref 34.0–46.6)
Hemoglobin: 13.3 g/dL (ref 11.1–15.9)
MCH: 29.3 pg (ref 26.6–33.0)
MCHC: 32.2 g/dL (ref 31.5–35.7)
MCV: 91 fL (ref 79–97)
Platelets: 281 10*3/uL (ref 150–450)
RBC: 4.54 x10E6/uL (ref 3.77–5.28)
RDW: 12.5 % (ref 11.7–15.4)
WBC: 6.7 10*3/uL (ref 3.4–10.8)

## 2023-04-20 LAB — BASIC METABOLIC PANEL
BUN/Creatinine Ratio: 14 (ref 12–28)
BUN: 12 mg/dL (ref 8–27)
CO2: 22 mmol/L (ref 20–29)
Calcium: 9.2 mg/dL (ref 8.7–10.3)
Chloride: 97 mmol/L (ref 96–106)
Creatinine, Ser: 0.88 mg/dL (ref 0.57–1.00)
Glucose: 139 mg/dL — ABNORMAL HIGH (ref 70–99)
Potassium: 4.4 mmol/L (ref 3.5–5.2)
Sodium: 134 mmol/L (ref 134–144)
eGFR: 69 mL/min/{1.73_m2} (ref >59)

## 2023-04-21 ENCOUNTER — Telehealth: Payer: Self-pay

## 2023-04-21 NOTE — Telephone Encounter (Signed)
FYI: Dr Marletta Lor  Returned the pt's call this morning and was advised by the pt that she hasn't heard from [place] regarding her stool samples. I called [facility] and they just received it yesterday @ 9:48am and it will take another 3 days at least. Phoned the pt back and advised her of this. I asked the pt has she taken any Imodium and she advises she had for got about that. So pt is going to do her Imodium up to twice a day for this weekend and if no better she has the number @ my desk to call me back so we can go a step further.

## 2023-04-22 LAB — GI PROFILE, STOOL, PCR

## 2023-04-24 NOTE — Telephone Encounter (Signed)
Pt called back wanting the results of her stool studies. I seen where they have posted now. Please advise

## 2023-04-25 NOTE — Telephone Encounter (Signed)
Stool studies negative, start Imodium 1-2 times daily to see if this helps with her symptoms.  Follow-up as scheduled.  Thank you

## 2023-04-25 NOTE — Telephone Encounter (Signed)
Dr Luciano Cutter:  Phoned the pt back with instructions of the Imodium and the pt stated she had used some this morning and it has already started to help. I advised the pt to continue to take it for now until the schedule comes out so she can get an appt. Pt expressed understanding

## 2023-04-26 ENCOUNTER — Telehealth: Payer: Self-pay | Admitting: Internal Medicine

## 2023-04-26 ENCOUNTER — Ambulatory Visit: Payer: Medicare Other | Admitting: Internal Medicine

## 2023-04-26 NOTE — Telephone Encounter (Signed)
Returned pt's call and LMOVM

## 2023-04-26 NOTE — Telephone Encounter (Signed)
Patient left a message that she spoke with you yesterday.  She would like for you to call her again.  That is all she said on the message.

## 2023-04-27 NOTE — Telephone Encounter (Signed)
Noted  

## 2023-05-01 NOTE — Telephone Encounter (Signed)
Phoned the pt and LMOVM to return call 

## 2023-05-01 NOTE — Telephone Encounter (Signed)
Pt canceled her last appt on Oct 2nd with Dr Marletta Lor

## 2023-05-02 NOTE — Telephone Encounter (Signed)
Pt mailed a letter to contact the office

## 2023-07-03 IMAGING — US US AXILLARY RIGHT
1 series · 6 of 6 positions shown · non-contrast
Comparison: Previous exam(s).

CLINICAL DATA: 74-year-old female with bilateral palpable areae
swelling and left breast skin thickening.

EXAM:
DIGITAL DIAGNOSTIC BILATERAL MAMMOGRAM WITH TOMOSYNTHESIS AND CAD;
US AXILLARY RIGHT; ULTRASOUND LEFT BREAST LIMITED
TECHNIQUE: Bilateral digital diagnostic mammography and breast tomosynthesis
was performed. The images were evaluated with computer-aided
detection.; Targeted ultrasound examination of the right axilla was
performed.; Targeted ultrasound examination of the left breast was
performed.

[Series 1: us axillary right · 0.09mm/px · 6 of 6 slices shown]
[im 1/6]
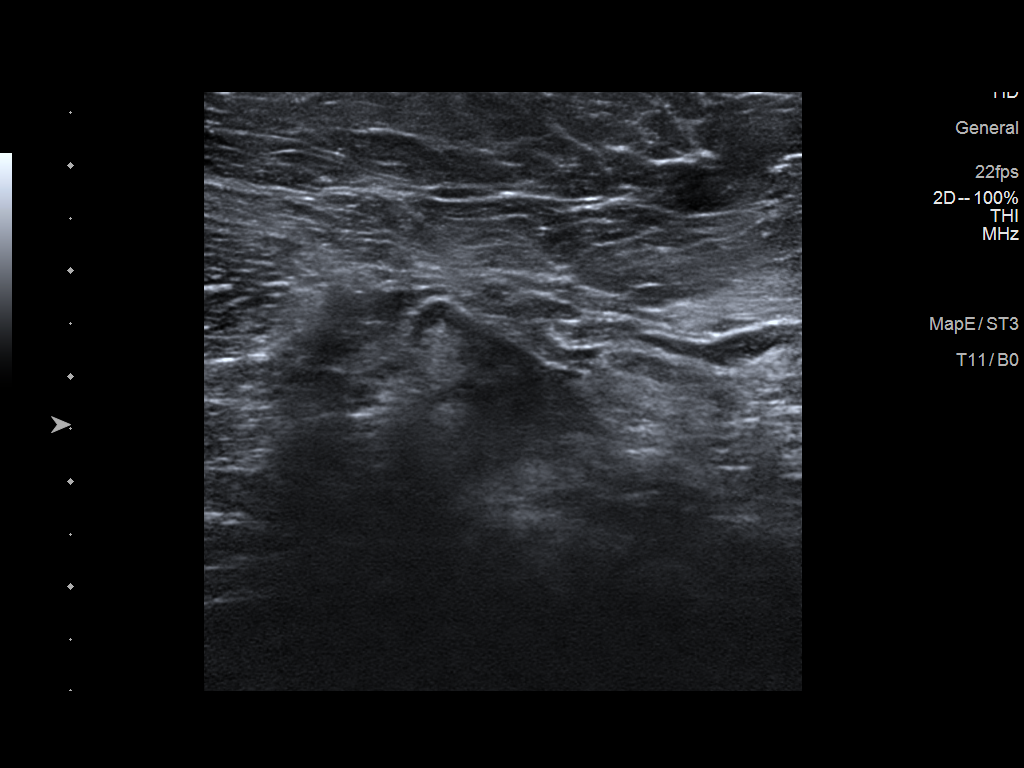
[im 2/6]
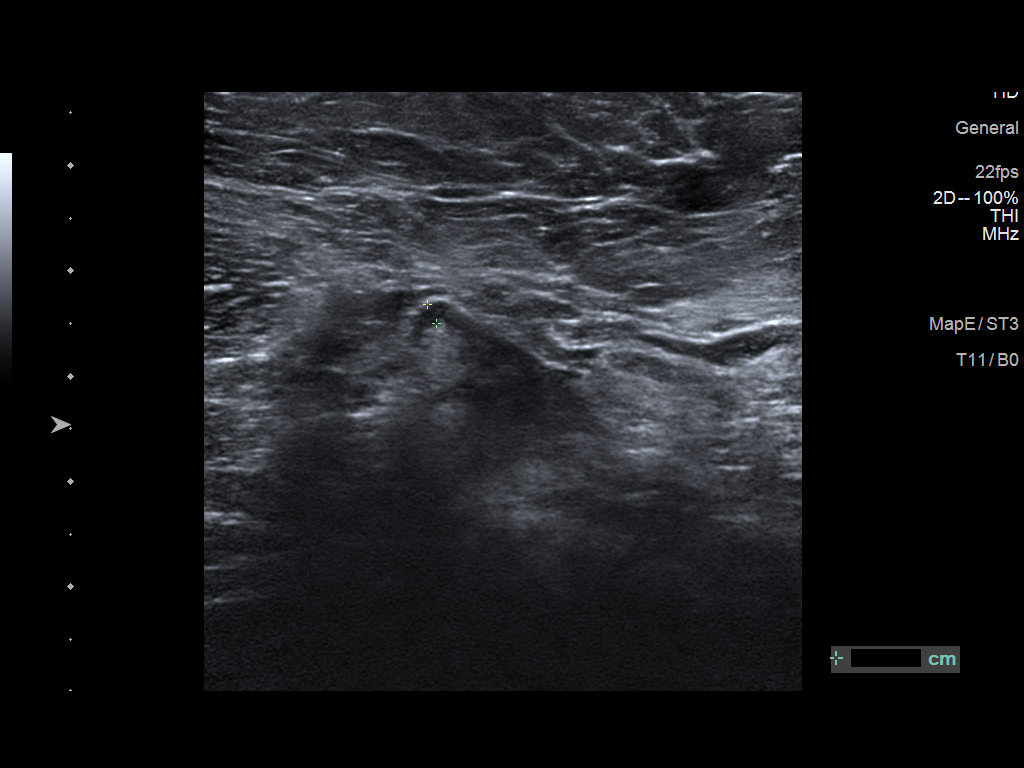
[im 3/6]
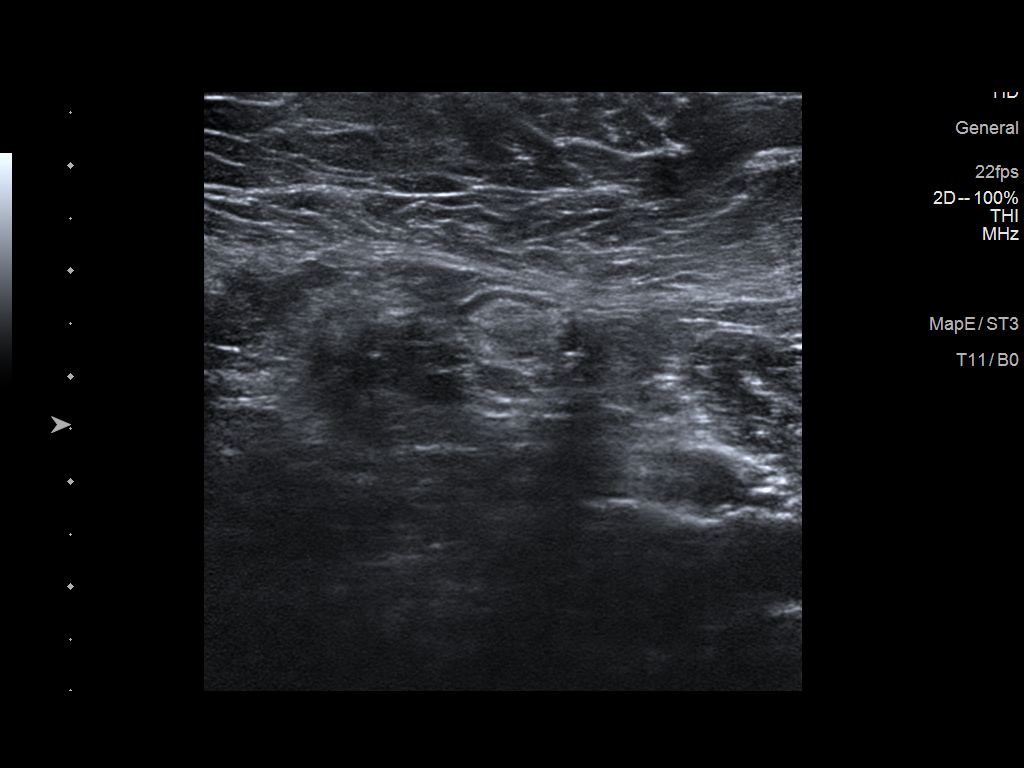
[im 4/6]
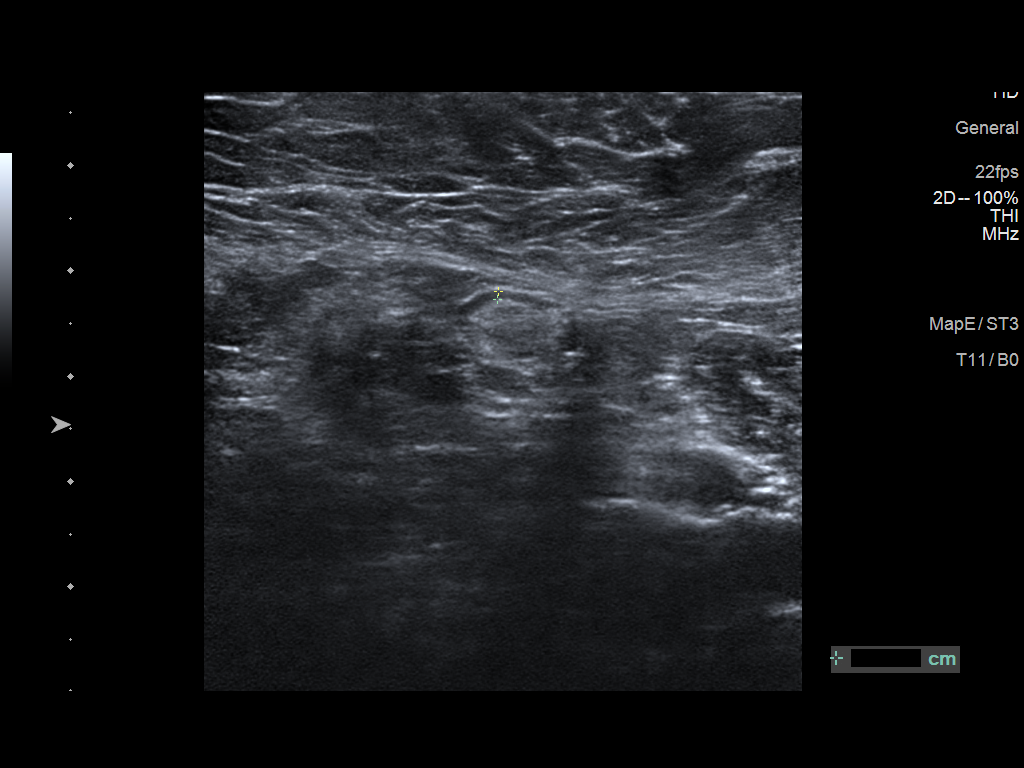
[im 5/6]
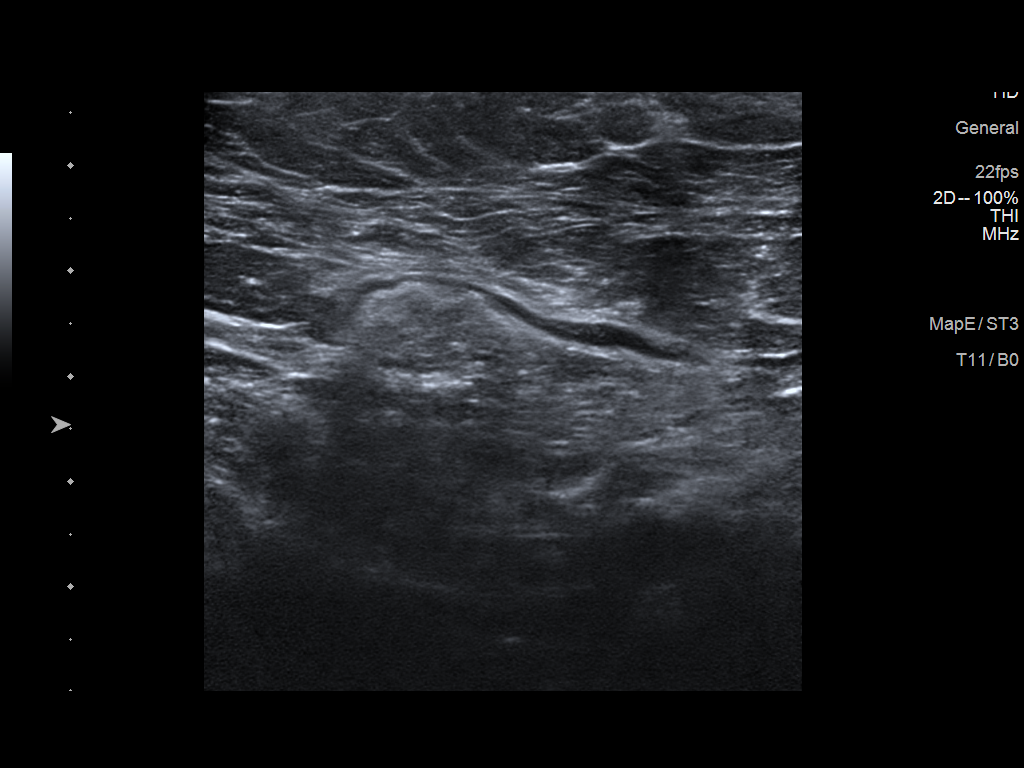
[im 6/6]
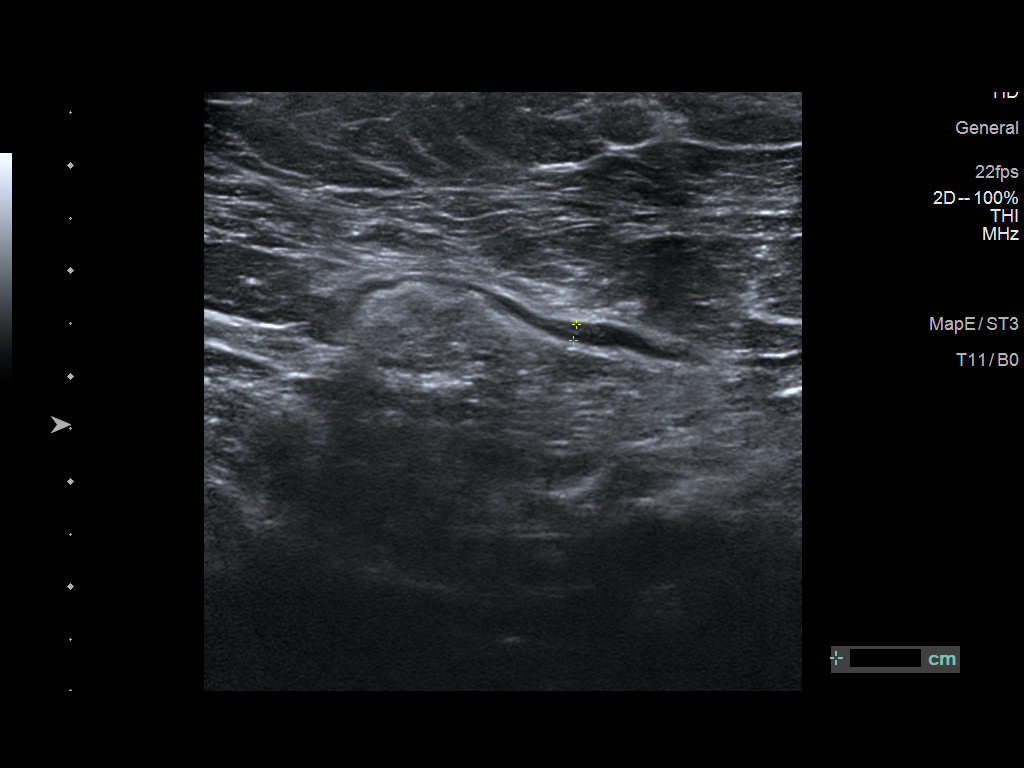

[6 of 6 positions shown; findings below may reference images not displayed]

ACR Breast Density Category b: There are scattered areas of
fibroglandular density.
FINDINGS: There are no suspicious mammographic findings in either breast. The
parenchymal pattern is stable.

On physical exam, there is a focal area of skin thickening along the
medial left breast.

Targeted ultrasound is performed, showing no focal or suspicious
sonographic abnormalities along the medial left breast. Evaluation
of the bilateral axilla demonstrates no suspicious findings.
Numerous morphologically normal lymph nodes are noted.
IMPRESSION: 1. No mammographic evidence of malignancy in either breast.
2. Unremarkable sonographic evaluation of the bilateral axilla and
left breast.

RECOMMENDATION:
1. Clinical and symptomatic follow-up is recommended for the
patient's focal left breast skin changes. Consider all dermatologic
evaluation if symptoms worsen or do not resolve.
2. Otherwise, routine annual screening mammography in 1 year.

I have discussed the findings and recommendations with the patient.
If applicable, a reminder letter will be sent to the patient
regarding the next appointment.

BI-RADS CATEGORY  1: Negative.

## 2023-07-03 IMAGING — MG DIGITAL DIAGNOSTIC BILAT W/ TOMO W/ CAD
8 of 14 series · 8 of 40 positions shown · non-contrast
Comparison: Previous exam(s).

CLINICAL DATA: 74-year-old female with bilateral palpable areae
swelling and left breast skin thickening.

EXAM:
DIGITAL DIAGNOSTIC BILATERAL MAMMOGRAM WITH TOMOSYNTHESIS AND CAD;
US AXILLARY RIGHT; ULTRASOUND LEFT BREAST LIMITED
TECHNIQUE: Bilateral digital diagnostic mammography and breast tomosynthesis
was performed. The images were evaluated with computer-aided
detection.; Targeted ultrasound examination of the right axilla was
performed.; Targeted ultrasound examination of the left breast was
performed.

[L CC synth-2D]
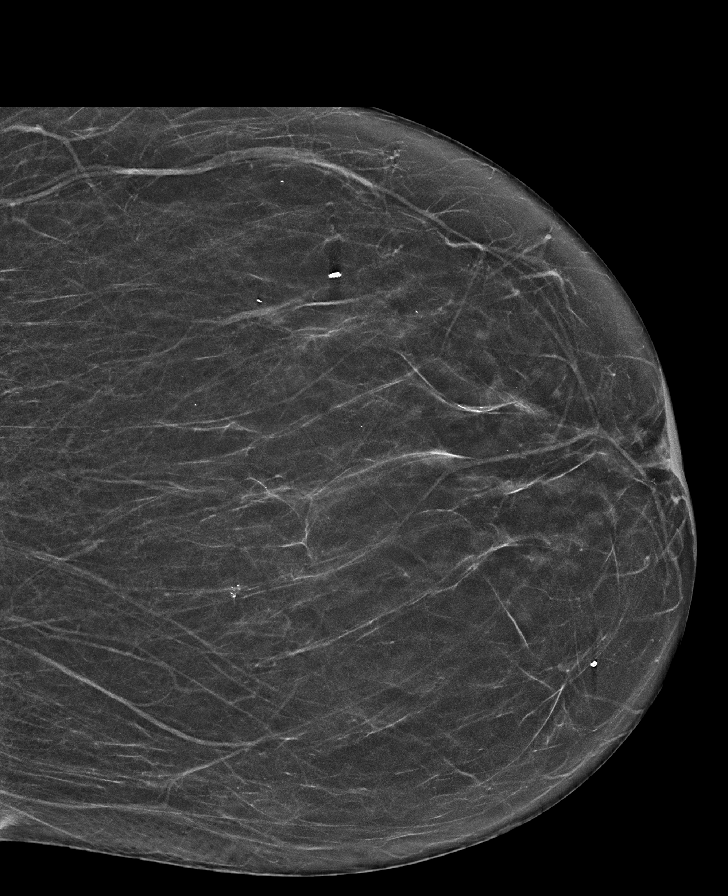

[R MLO synth-2D (1 of 3)]
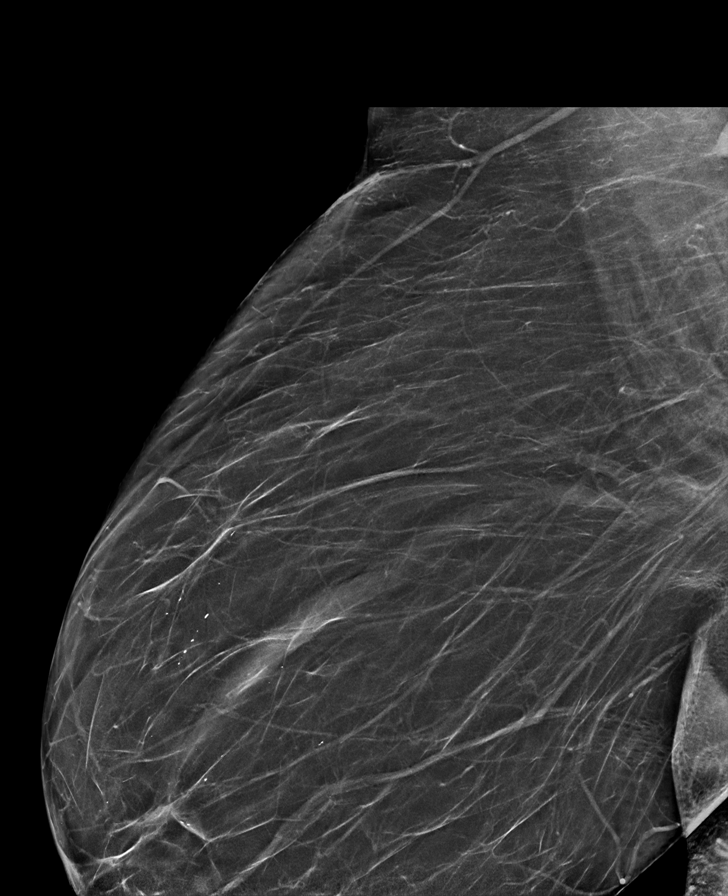

[L MLO synth-2D (1 of 2)]
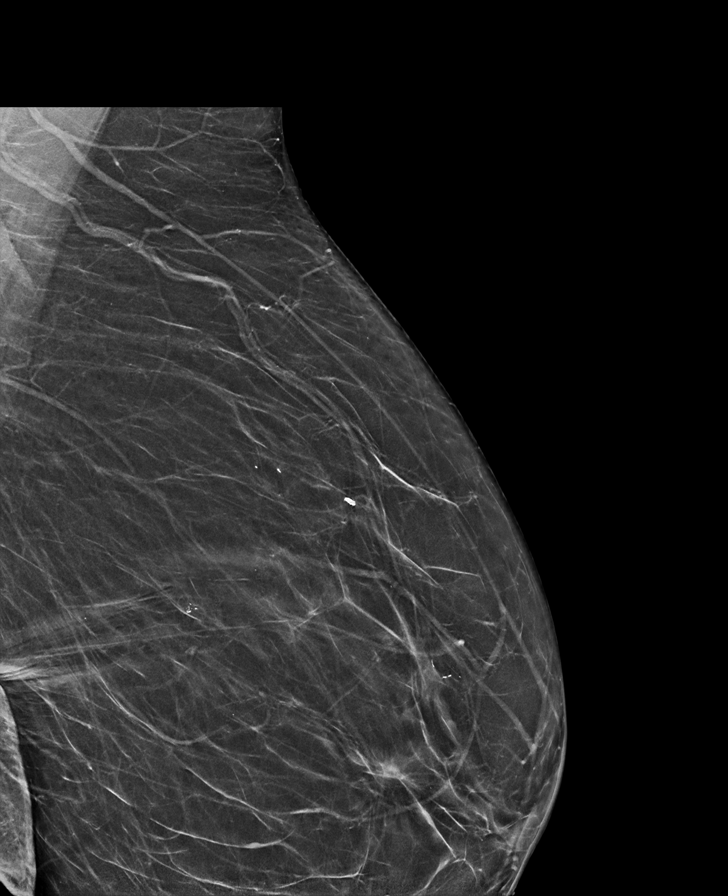

[R CC synth-2D]
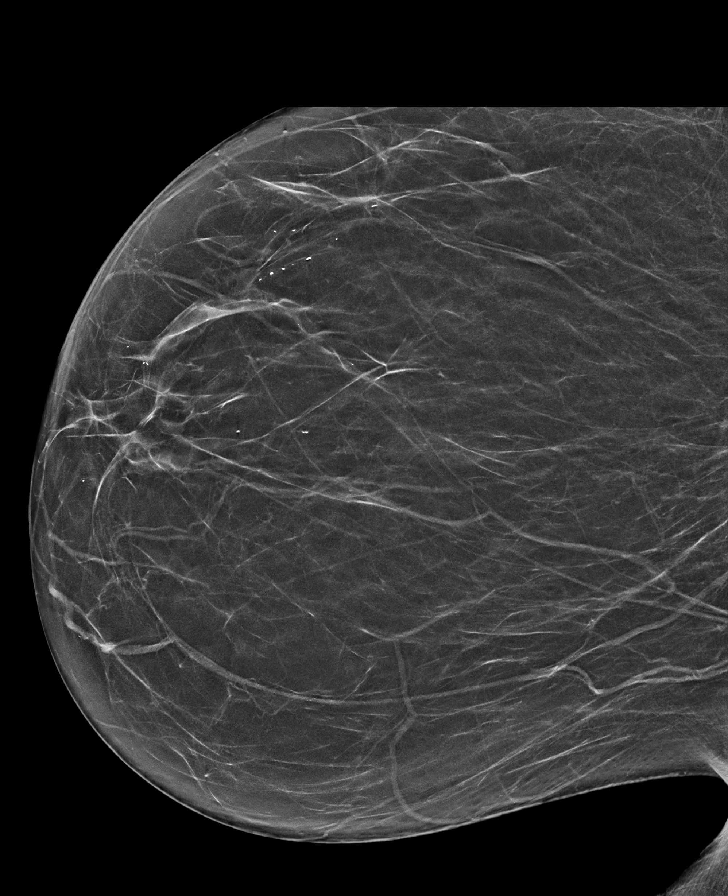

[L MLO synth-2D (2 of 2)]
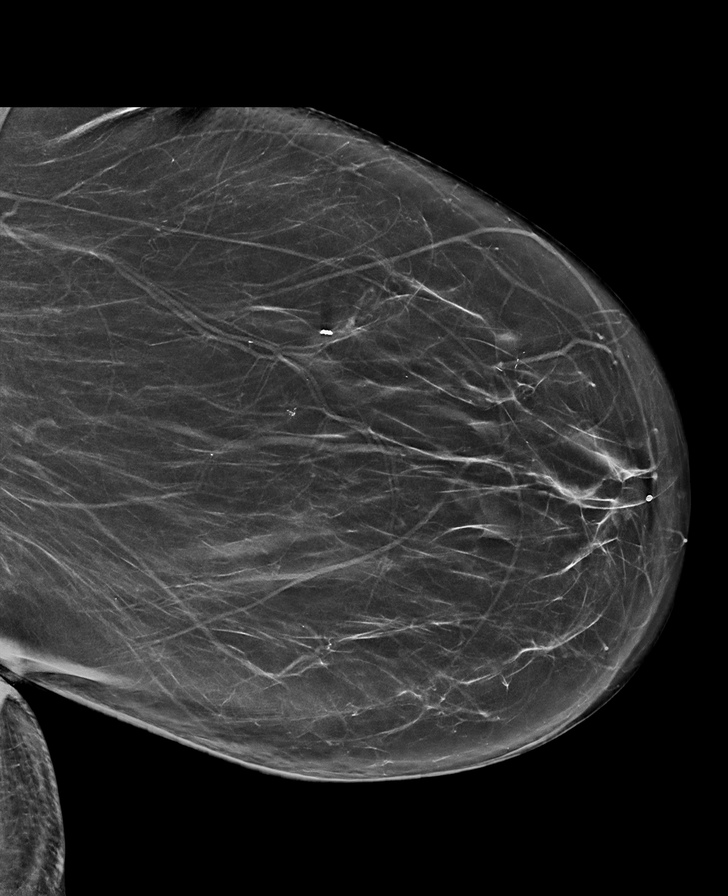

[R MLO synth-2D (2 of 3)]
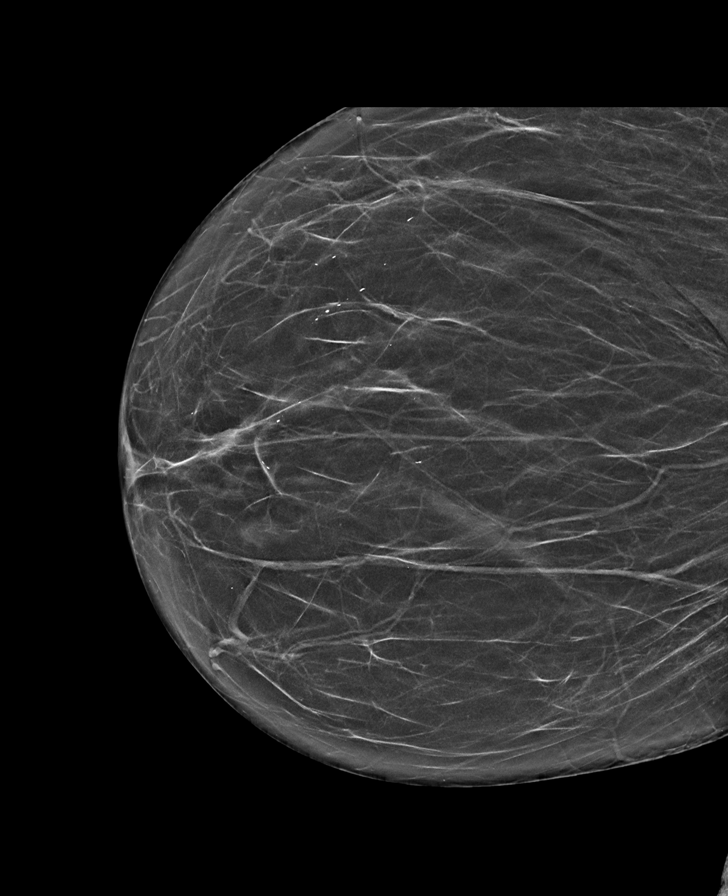

[R MLO synth-2D (3 of 3)]
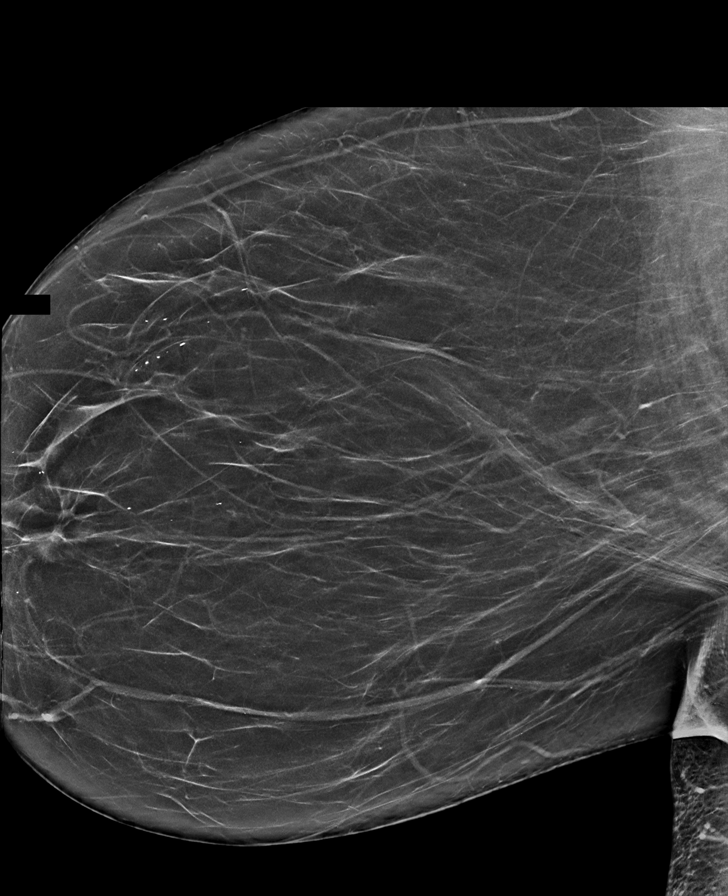

[R MLO tomo · tomo slice 39/77.0]
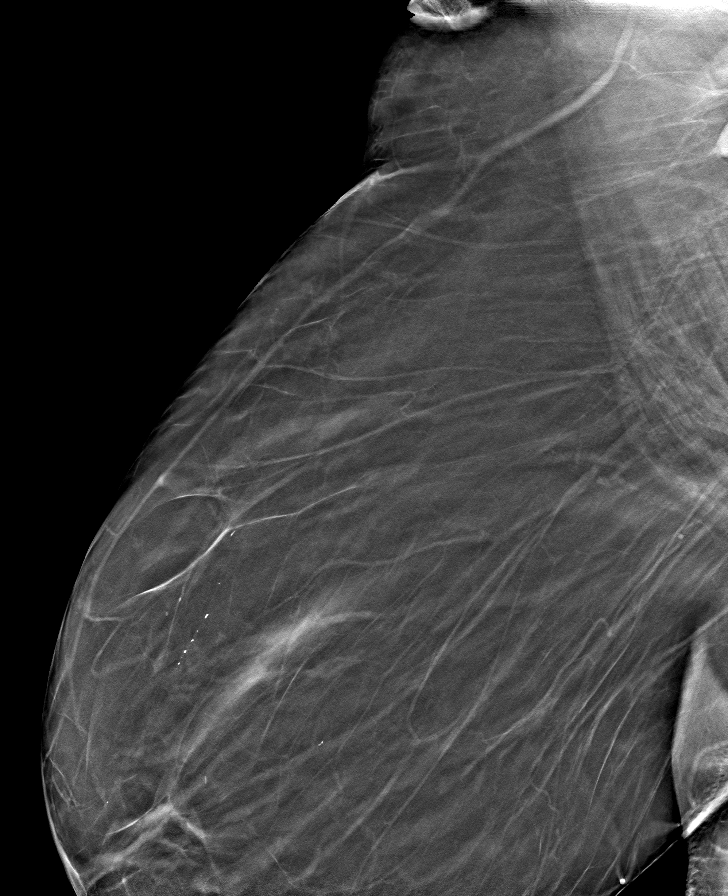

[8 of 40 positions shown; findings below may reference images not displayed]

ACR Breast Density Category b: There are scattered areas of
fibroglandular density.
FINDINGS: There are no suspicious mammographic findings in either breast. The
parenchymal pattern is stable.

On physical exam, there is a focal area of skin thickening along the
medial left breast.

Targeted ultrasound is performed, showing no focal or suspicious
sonographic abnormalities along the medial left breast. Evaluation
of the bilateral axilla demonstrates no suspicious findings.
Numerous morphologically normal lymph nodes are noted.
IMPRESSION: 1. No mammographic evidence of malignancy in either breast.
2. Unremarkable sonographic evaluation of the bilateral axilla and
left breast.

RECOMMENDATION:
1. Clinical and symptomatic follow-up is recommended for the
patient's focal left breast skin changes. Consider all dermatologic
evaluation if symptoms worsen or do not resolve.
2. Otherwise, routine annual screening mammography in 1 year.

I have discussed the findings and recommendations with the patient.
If applicable, a reminder letter will be sent to the patient
regarding the next appointment.

BI-RADS CATEGORY  1: Negative.

## 2024-07-23 ENCOUNTER — Other Ambulatory Visit: Payer: Self-pay

## 2024-07-23 DIAGNOSIS — I872 Venous insufficiency (chronic) (peripheral): Secondary | ICD-10-CM

## 2024-08-22 ENCOUNTER — Ambulatory Visit

## 2024-08-22 ENCOUNTER — Ambulatory Visit (HOSPITAL_COMMUNITY)
